# Patient Record
Sex: Male | Born: 1950
Health system: Southern US, Community
[De-identification: ages and names within clinical notes are randomized; demographics above are authoritative.]

## PROBLEM LIST (undated history)

## (undated) DIAGNOSIS — R112 Nausea with vomiting, unspecified: Secondary | ICD-10-CM

## (undated) DIAGNOSIS — Z9889 Other specified postprocedural states: Secondary | ICD-10-CM

## (undated) DIAGNOSIS — I10 Essential (primary) hypertension: Secondary | ICD-10-CM

## (undated) DIAGNOSIS — K759 Inflammatory liver disease, unspecified: Secondary | ICD-10-CM

## (undated) DIAGNOSIS — G4730 Sleep apnea, unspecified: Secondary | ICD-10-CM

## (undated) HISTORY — PX: OTHER SURGICAL HISTORY: SHX169

---

## 2005-01-31 ENCOUNTER — Ambulatory Visit (HOSPITAL_COMMUNITY): Admission: RE | Admit: 2005-01-31 | Discharge: 2005-01-31 | Payer: Self-pay | Admitting: Gastroenterology

## 2009-07-22 ENCOUNTER — Ambulatory Visit: Payer: Self-pay | Admitting: Sports Medicine

## 2009-07-22 DIAGNOSIS — M25519 Pain in unspecified shoulder: Secondary | ICD-10-CM | POA: Insufficient documentation

## 2009-07-22 DIAGNOSIS — M67919 Unspecified disorder of synovium and tendon, unspecified shoulder: Secondary | ICD-10-CM | POA: Insufficient documentation

## 2009-07-22 DIAGNOSIS — M719 Bursopathy, unspecified: Secondary | ICD-10-CM

## 2009-07-22 DIAGNOSIS — M76829 Posterior tibial tendinitis, unspecified leg: Secondary | ICD-10-CM | POA: Insufficient documentation

## 2009-07-22 DIAGNOSIS — M25579 Pain in unspecified ankle and joints of unspecified foot: Secondary | ICD-10-CM | POA: Insufficient documentation

## 2009-08-11 ENCOUNTER — Ambulatory Visit: Payer: Self-pay | Admitting: Sports Medicine

## 2009-08-11 DIAGNOSIS — M21962 Unspecified acquired deformity of left lower leg: Secondary | ICD-10-CM

## 2009-08-11 DIAGNOSIS — M21961 Unspecified acquired deformity of right lower leg: Secondary | ICD-10-CM

## 2009-08-19 ENCOUNTER — Ambulatory Visit: Payer: Self-pay | Admitting: Sports Medicine

## 2009-09-22 ENCOUNTER — Ambulatory Visit: Payer: Self-pay | Admitting: Sports Medicine

## 2009-11-11 ENCOUNTER — Ambulatory Visit: Payer: Self-pay | Admitting: Sports Medicine

## 2010-06-02 NOTE — Assessment & Plan Note (Signed)
Summary: NP,SHOULDER/ANKLE PAIN,GOLFER,MC   Vital Signs:  Patient profile:   60 year old male Height:      74 inches Weight:      238 pounds BMI:     30.67 BP sitting:   162 / 97  Vitals Entered By: Lillia Pauls CMA (July 22, 2009 1:36 PM)  History of Present Illness: Left medial ankle pain x 1 yr 3 to 4 mos of swellign and inc pain wearing ace bandage and ankle lock with tape this has helped still catches on turn sloped hill standing hurts pulling foot back into plantar flex hurts  playing golf jan no warmup did an aggressive sweing has had pain post shoulder ever since hit golf balls recently and had pain w all of these but grad gets less   Physical Exam  General:  Well-developed,well-nourished,in no acute distress; alert,appropriate and cooperative throughout examination Msk:  marked pes planus bilat w shift of midfoot  swelling along post edge of med malleolus direct TTP on inf and post edge of malleolus pain on resisted testing post tib does not fxn on heel raise  ankle ligs stable  gait pronated  .RC testing wnl except weakness of ER this is consistent in all planes and only on left neg signs of impingement     Impression & Recommendations:  Problem # 1:  ANKLE PAIN, LEFT (ICD-719.47) This is persisten but very positional Ice as needed NSAIDS ankle support  Problem # 2:  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

## 2010-06-02 NOTE — Assessment & Plan Note (Signed)
Summary: fu foot per Mirabelle Cyphers/mjd   Vital Signs:  Patient profile:   60 year old male BP sitting:   131 / 90  Vitals Entered By: Lillia Pauls CMA (Sep 22, 2009 1:51 PM)  History of Present Illness: doing better using brace and or othotics  no swelling able to walk and stand w minimal pain somteimes gets mild pain toward end of day or if standing too long or walking steps hurts just at left medial malleolus tip  tried without brace 1 day and had a lot more pain  orthotics feel comfortable   Physical Exam  General:  Well-developed,well-nourished,in no acute distress; alert,appropriate and cooperative throughout examination Msk:  LEFT ankle shows no swelling; stable lateral and medial ligaments; squeeze test and kleiger test unremarkable; talar dome seems nontender; no sign of peroneal tendon subluxations; no pain at base of 5th MT. 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

## 2010-06-02 NOTE — Assessment & Plan Note (Signed)
Summary: F/U ANKLE/SHOULDER,MC   Vital Signs:  Patient profile:   60 year old male BP sitting:   136 / 84  Vitals Entered By: Lillia Pauls CMA (August 11, 2009 10:47 AM)  History of Present Illness: Notes significant improvement wrt to his left shoulder and left ankle pain. Performing left shoulder theraband exercises (mainly ER). Still some left shoulder pain only on end ER in flexed & abducted position; relieved by position change. Has played golf in the interim, with decreased pain on swinging.  Left ankle pain and swelling have significantly decreased. No paresthesias. Walking without pain. Equivocal re: moderating factors and when he experiences pain while getting in and out of golf cart. No tearing sensation.    Physical Exam  General:  Well-developed,well-nourished,in no acute distress; alert,appropriate and cooperative throughout examination Msk:  SHOULDERS: Full ROM. Significantly increased left ER strength. Decreased pain on end ER on left. (-) Hawkins/Jobe on left. No ttp.  ANKLES: Mild to moderate swelling along left posterior tibialis distribution. Mild respective ttp behind left medial malleolus. Significantly limited inversion with 3/5 strength. Trouble with end heel raise on left though he is able to perform this maneuver. Longitudinal arch collapse with excessive pronation.   Additional Exam:  Musculoskeletal Ultrasound: Longitudinal and transverse views of the posterior left ankle revealed the following:  Partial intrasubstance tear of the posterior tibialis tendon with significant hypoechogenic surrounding areas consistent with fluid collection. Intacnt flexor tendons.   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  - Gentle pidgeon-toe heel raises daily as tolerated. Start with 15 daily and gradually transition toward 30  reps/daily as tolerated. - Continue to wear shoes with comforthotics. - Avoid running or other activities which require forceful plantarflexion and inversion of the ankles/feet. - RTC in 1-2 wks for reassessment and custom orthotic construction   Orders: [place] LIMITED ([id number])  Problem # 2:  ROTATOR CUFF SYNDROME, LEFT (ICD-726.10) Assessment: Improved  - Continue current rehab routine.  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 1/4 patch to affected area daily Prescriptions: NITRO-DUR 0.2 MG/HR PT24 (NITROGLYCERIN) 1/4 patch to affected area daily  #30 x 0   Entered and Authorized by:   Valarie Merino MD   Signed by:   Valarie Merino MD on 08/11/2009   Method used:   Print then Give to Patient   RxID:   [id number] NITRO-DUR 0.2 MG/HR PT24 (NITROGLYCERIN) 1/4 patch to affected area daily  #30 x 0   Entered and Authorized by:   Valarie Merino MD   Signed by:   Valarie Merino MD on 08/11/2009   Method used:   Print then Give to Patient   RxID:   [id number]   Appended Document: F/U ANKLE/SHOULDER,MC Problem #1: NTG patches. Counseled re: potential adverse medication effects.

## 2010-06-02 NOTE — Assessment & Plan Note (Signed)
Summary: 11:30 APPT PER [facility]   History of Present Illness: 60 yo M returns for 1 week f/u of left ankle posterior tibialis tendon partial tear for custom orthotics  Significant pes planus and almost ankle subluxation L especially on last exam Able to do exercises prescribed at last visit Tried nitro patches but caused severe headache so had to stop. Has done well with temporary sports insoles  Does feel some better even from last visit a week ago.    Medications Prior to Update: 1)  Nitro-Dur 0.2 Mg/hr Pt24 (Nitroglycerin) .... 1/4 Patch To Affected Area Daily  Physical Exam  General:  Well-developed,well-nourished,in no acute distress; alert,appropriate and cooperative throughout examination Msk:  L ankle:: Mild to moderate swelling along left posterior tibialis distribution.  Mild ttp behind left medial malleolus.  Significantly limited inversion with 3/5 strength. Difficulty with heel raise More collapse of left longitudinal arch compared to right Significant pes planus. No hallux rigidus. Wide forefoot and transverse arch collapse.   Impression & Recommendations:  Problem # 1:  ANKLE PAIN, LEFT (ICD-719.47) Assessment Improved  Patient was fitted for a : standard, cushioned, semi-rigid orthotic. The orthotic was heated and afterward the patient stood on the orthotic blank positioned on the orthotic stand. The patient was positioned in subtalar neutral position and 10 degrees of ankle dorsiflexion in a weight bearing stance. After completion of molding, a stable base was applied to the orthotic blank. The blank was ground to a stable position for weight bearing. Size: 12 blue fasttek Base: blue med density eva Posting: first ray on left Additional orthotic padding: medial heel wedges bilaterally Total prep time 45 minutes.  These felt comfortable to patient, gait much improved with minimal pronation on left only with pushoff.  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  d/c nitro as caused headaches.  F/u in 5 weeks for reevaluation, ultrasound.  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  Orders: Orthotic Materials, each unit [phone])

## 2010-06-02 NOTE — Assessment & Plan Note (Signed)
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  Still doing better using brace and othotics  mild swelling able to walk and stand w no pain orthotics feel comfortable except when golfing, he tapes his foot to help maintain arch and this resolves pain. Not doing strength exercises yet (has been letting it heal) Motrin helps his occasional pain.   Current Medications (verified): 1)  None  Allergies (verified): No Known Drug Allergies  Review of Systems       See HPI  Physical Exam  General:  Well-developed,well-nourished,in no acute distress; alert,appropriate and cooperative throughout examination Msk:  LEFT ankle shows no swelling; stable lateral and medial ligaments; squeeze test and kleiger test unremarkable; talar dome nontender; no sign of peroneal tendon subluxations; no pain at base of 5th MT. Testing of Post tib in plantarflexion position causes mild pain testing of flex dig and flex hallucis are neg standing shows marked loss of long arch and midfoot collapse bilat heel raise shows better post tib fxn RT than LT Additional Exam:  MSK US Shows healing and improved homogenicity of PT tendon.  There are still some areas with fluid filled gaps.  There is minimal increased vascularity.  Images saved.   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 Cont ankle brace 3 to 6 mos total since signs of healing seen on [place]. good progress to date ice daily RTC 6 weeks.  Orders: [place] LIMITED ([id number])  Problem # 2:  OTHER ACQUIRED DEFORMITY OF  ANKLE AND FOOT OTHER (ICD-736.79) Assessment: Unchanged Severe pes planus, use orthotics as often as possible.  Patient Instructions: 1)  Great to meet you today, 2)  your tendon is still injured but looks better. 3)  Start the theraband exercises as we discussed, 3 sets of 10 each. Back off if it starts to hurt. 4)  Come back in 6 weeks. 5)  Be sure to follow up your blood pressure of 170/100 with your family doctor! 6)  -Dr. Benjamin Stain

## 2010-09-16 NOTE — Op Note (Signed)
NAME:  Robert Barron, Robert Barron NO.:  [id number]   MEDICAL RECORD NO.:  [id number]          PATIENT TYPE:  AMB   LOCATION:  ENDO                         FACILITY:  [facility]   PHYSICIAN:  Danise Edge, M.D.   DATE OF BIRTH:  December 31, 1950   DATE OF PROCEDURE:  01/31/2005  DATE OF DISCHARGE:                                 OPERATIVE REPORT   PROCEDURE:  Screening colonoscopy.   REFERRING PHYSICIAN:  Dr. Kirby Funk.   INDICATIONS:  [name] is a 60 year old male born February 23, 1951. [name] is scheduled to undergo his first screening colonoscopy  with polypectomy to prevent colon cancer.   ENDOSCOPIST:  Danise Edge, M.D.   PREMEDICATION:  Versed 7 mg, Demerol 70 mg.   DESCRIPTION OF PROCEDURE:  After obtaining informed consent, [name]  was placed in the left lateral decubitus position. I administered  intravenous Demerol and intravenous Versed to achieve conscious sedation for  the procedure. The patient's blood pressure, oxygen saturation and cardiac  rhythm were monitored throughout the procedure and documented in the medical  record.   Anal inspection was normal. Digital rectal exam revealed a nonnodular  prostate. The Olympus adjustable pediatric colonoscope was introduced into  the rectum and advanced to the cecum. A normal-appearing ileocecal valve and  appendiceal orifice were identified. Colonic preparation for the exam today  was excellent.   RECTUM:  Normal. Retroflex view of the distal rectum normal.  SIGMOID COLON AND DESCENDING COLON:  Normal.  SPLENIC FLEXURE:  Normal.  TRANSVERSE COLON:  Normal.  HEPATIC FLEXURE:  Normal.  ASCENDING COLON:  Normal.  CECUM AND ILEOCECAL VALVE:  Normal.   ASSESSMENT:  Normal screening proctocolonoscopy to the cecum.           ______________________________  Danise Edge, M.D.    MJ/MEDQ  D:  01/31/2005  T:  01/31/2005  Job:  [id number]   cc:   Thora Lance, M.D.  Fax: [phone]

## 2011-05-05 ENCOUNTER — Ambulatory Visit (INDEPENDENT_AMBULATORY_CARE_PROVIDER_SITE_OTHER): Payer: BC Managed Care – PPO | Admitting: Sports Medicine

## 2011-05-05 ENCOUNTER — Encounter: Payer: Self-pay | Admitting: Sports Medicine

## 2011-05-05 VITALS — BP 130/81 | HR 60 | Ht 74.0 in | Wt 238.0 lb

## 2011-05-05 DIAGNOSIS — M76829 Posterior tibial tendinitis, unspecified leg: Secondary | ICD-10-CM

## 2011-05-05 DIAGNOSIS — M216X9 Other acquired deformities of unspecified foot: Secondary | ICD-10-CM

## 2011-05-05 MED ORDER — KETOPROFEN POWD: Status: DC

## 2011-05-05 NOTE — Patient Instructions (Signed)
Continue to use orthotics in all shoes  We want to replace more rigid ankle brace with a compression sleeve that you can use when walking and standing  Use arch pads in any shoes that will not allow orthotics  Try topical cream over foot if you have pain

## 2011-05-05 NOTE — Assessment & Plan Note (Addendum)
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  The orthotic was heated and the patient stood on the orthotic blank positioned on the orthotic stand. The patient was positioned in subtalar neutral position and 10 degrees of ankle dorsiflexion in a weight bearing stance. After molding, a stable Fast-Tech EVA base was applied to the orthotic blank.   The blank was ground to a stable position for weight bearing. Size:12 blue leather base: Blue EVA posting: heel wedges additional orthotic padding: first ray posts  Preparation time 40 minutes  Gait is significantly improved after placement of orthotics  He should continue to use orthotics in all shoes  Recheck if this is causing problems or the orthotics are wearing out

## 2011-05-05 NOTE — Progress Notes (Signed)
  Subjective:    Patient ID: Robert Barron, male    DOB: [birth year], 61 y.o.   MRN: [id number]  HPI  Pt presents to clinic for f/u of Lt post tib tendinopathy that had been doing well until 1 month ago.  He was going up and down stairs a lot without his ankle brace, and this aggravated his pain.  Had continued to tape ankle for athletics. Using orthotics in all shoes which are helpful.   He would like to get a new pair of  Orthotics as he has to use them in multiple shoes  Review of Systems     Objective:   Physical Exam nad  Lt foot/ankle exam: Complete loss of long arch with shift of mid foot Hyperemic color change over entire mid foot Ankle ligaments stable No swelling over med malleolus No ttp of ankle  Mild calcaneal valgus  Color change noted on lt foot, not present on rt- slight reddening over mid foot collapse on rt  Increase in calcaneal valgus on rt  Smoking gait shows extreme pronation     Assessment & Plan:

## 2011-05-05 NOTE — Assessment & Plan Note (Signed)
I think the active tendinopathy is much better  The posterior tibial tendon still does not function well because of the degree of arch collapse

## 2012-02-29 ENCOUNTER — Ambulatory Visit (INDEPENDENT_AMBULATORY_CARE_PROVIDER_SITE_OTHER): Payer: BC Managed Care – PPO | Admitting: Sports Medicine

## 2012-02-29 ENCOUNTER — Encounter: Payer: Self-pay | Admitting: Sports Medicine

## 2012-02-29 VITALS — BP 147/81 | HR 54 | Ht 74.0 in | Wt 240.0 lb

## 2012-02-29 DIAGNOSIS — M25569 Pain in unspecified knee: Secondary | ICD-10-CM

## 2012-02-29 DIAGNOSIS — M25561 Pain in right knee: Secondary | ICD-10-CM | POA: Insufficient documentation

## 2012-02-29 MED ORDER — MELOXICAM 15 MG PO TABS: 15.0000 mg | ORAL_TABLET | Freq: Every day | ORAL | Status: DC

## 2012-02-29 NOTE — Assessment & Plan Note (Signed)
Certainly with patellar tendinosis.  Additionally medial meniscus injury is possible.  Plan: Relative rest oral meloxicam topical Aspercreme, ice, compressive knee sleeve as tolerated.  Gentle range of motion exercises.  Followup in 2-3 weeks.  Patient expresses understanding

## 2012-02-29 NOTE — Progress Notes (Signed)
Robert Barron is a 61 y.o. male who presents to [facility] today for right anterior knee pain.  Starting yesterday after playing golf.  Patient pushed his golf bag in a cart up and down hills yesterday while playing golf.  He noted soreness in the anterior portion of his left knee but worsened overnight and became stiff and painful this morning.  He has significant -- moderate pain.  Pain worsens with knee extension and improves with rest. The pain is localized to the anterior and medial portion of his right knee.  He denies any locking catching or significant swelling.  He has not tried any pain medications.  He denies any radiating pain weakness or numbness and feels well otherwise.   Today he is walking with a significant limp because it hurts to bend the knee too much or straighten it out completely  Previously has been treated for posterior tibialis tendinitis with nitroglycerin patches which caused significant headaches.   PMH reviewed. Mildly elevated blood pressure History  Substance Use Topics  . Smoking status: Never Smoker   . Smokeless tobacco: Never Used  . Alcohol Use: Not on file   ROS as above otherwise neg   Exam:  BP 147/81  Pulse 54  Ht 6\' 2"  (1.88 m)  Wt 240 lb (108.863 kg)  BMI 30.81 kg/m2 Gen: Well NAD MSK: Right knee: Normal-appearing no significant visual effusion or erythema. Mildly tender over the patellar tendon and medial joint line Range of motion 0-120 pain on extension.  Strength 4+/5 to extension 5/5 to flexion Negative patellar grind and patellar apprehension tests Mildly positive medial McMurray's test however difficult to interpret secondary to anterior knee pain. Negative Lachman's valgus varus stress.   Distal pulses sensation and capillary refill are intact no significant distal edema bilaterally  Musculoskeletal ultrasound of the right knee: Patellar tendon thickness 0.48 mm. Diffuse hypoechoic change within the proximal and distal portions  seen on longitudinal and transverse views. No significant neovessels formation within the tendon. Quad tendon intact and normal appearing mild effusion seen.  Medial meniscus normal appearing as is lateral meniscus.   No results found.

## 2012-02-29 NOTE — Patient Instructions (Addendum)
Thank you for coming in today. You have patellar-tendonitis.  You may also have a meniscus injury to the inside part of the knee.  Please take the meloxicam as needed daily.  Apply the aspercream as needed to the front part of the knee.  Ice massage.  Please take it easy and come back in 2-3 weeks.  Rehab on a stationary bike with the seat high.

## 2012-03-13 ENCOUNTER — Ambulatory Visit (INDEPENDENT_AMBULATORY_CARE_PROVIDER_SITE_OTHER): Payer: BC Managed Care – PPO | Admitting: Family Medicine

## 2012-03-13 ENCOUNTER — Encounter: Payer: Self-pay | Admitting: Family Medicine

## 2012-03-13 VITALS — BP 151/88 | HR 63 | Ht 74.0 in | Wt 240.0 lb

## 2012-03-13 DIAGNOSIS — M25569 Pain in unspecified knee: Secondary | ICD-10-CM

## 2012-03-13 DIAGNOSIS — M25561 Pain in right knee: Secondary | ICD-10-CM

## 2012-03-13 DIAGNOSIS — I10 Essential (primary) hypertension: Secondary | ICD-10-CM

## 2012-03-13 NOTE — Progress Notes (Signed)
I was present during Robert Barron interview, exam and agree with his findings and his assessment and plan.   Sirus Labrie

## 2012-03-13 NOTE — Patient Instructions (Addendum)
Thank you for coming in today. Come back as needed.  Wear the sleeve during and 1 hour after exercise.  Take the mobic only as needed.  Avoid deep knee bends and squats.

## 2012-03-13 NOTE — Progress Notes (Signed)
Patient ID: Robert Barron, male   DOB: [birth year], 61 y.o.   MRN: [id number]  SUBJECTIVE: Robert Barron is a 61 y.o. male with unremarkable PMHx presenting to sports medicine clinic for f/u appointment of RIGHT knee pain.  Pt seen last on 02/29/2012 and thought to have anterior, medial knee pain with probably patellar tendinosis and +/- medial meniscal injury based on physical and sonographic findings.  Since that visit patient has been using body helix sleeve with activity or on tough days and for 1 hour following activity, took scheduled meloxicam for 2 weeks to combat swelling, inflammation.  Patient reports occasional stiffness with prolonged sitting in knee flexion (at work).  Otherwise, patient is pain-free throughout the day and with activity.  He has allowed knee to rest and avoided golfing, biking or heavy lifting.  He has been pain-free with ADL and prolonged walking.  Denies any repeat knee swelling, locking or instability.  He has occasional popping but that is not new.  Reports not using anti-inflammatories or knee sleeve since Saturday with no repeat knee swelling or pain since self-removing both.    PMHx: None  PSHx: None  Meds: None scheduled  Allergies: None  OBJECTIVE: Vital signs as noted to right.  Slightly elevated BP (151/88) otherwise normal. Appearance --> Slightly obese male but is doing well  Knee exam:  - No abnormalities on inspection (erythema, edema or edema) - AROM, PROM in bilateral knee flexion and extension - 5/5 knee flexion and extension.  Some pain with resisted knee flexion reproduced at medial aspect of RIGHT knee - Negative Lachman's  - Negative Anterior/Posterior drawer - Positive McMurray's at medial aspect of knee with popping, pain (RIGHT) - Positive posterior dial test with pain at medial aspect of knee joint,  posterior aspect - Negative Valgus and varus testing with firm endpoint.  Valgus testing at 0 and 30 degrees reproduced  medial knee pain on RIGHT knee, worse in 0 degrees than 30.    ASSESSMENT: 1. Medial meniscal pathology vs degeneration of RIGHT knee 2. Elevated blood pressure not diagnostic of HTN  PLAN: Given age, activity level patient likely has degenerative process in RIGHT knee, medial aspect with potential meniscal pathology in same location.  Will avoid any activities that exacerbate pain, mainly major knee flexion.  Encouraged gradual return to golf, stationary exercise bike but to wear body helix during both as indicated in patient instructions.  Oral NSAIDs as needed and icing for any pain.    Instructed on red-flags and reasons to return to care for knee, mainly if it inhibits his ADLs or interferes with important aspect of his life, exercise routine (golf) He will follow-up as needed with [place] for his knee pain. Also, informed patient he should follow-up with his PCM regarding blood pressure as it was elevated today at 151/88.  -- Kenney Houseman, MS IV

## 2013-02-12 ENCOUNTER — Encounter: Payer: Self-pay | Admitting: Emergency Medicine

## 2013-02-12 ENCOUNTER — Ambulatory Visit (INDEPENDENT_AMBULATORY_CARE_PROVIDER_SITE_OTHER): Payer: BC Managed Care – PPO | Admitting: Emergency Medicine

## 2013-02-12 VITALS — BP 174/82 | HR 57 | Ht 74.0 in | Wt 240.0 lb

## 2013-02-12 DIAGNOSIS — M216X9 Other acquired deformities of unspecified foot: Secondary | ICD-10-CM

## 2013-02-12 NOTE — Progress Notes (Signed)
Patient ID: Robert Barron, male   DOB: Sep 08, 1950, 62 y.o.   MRN: [id number] 62 year old male presents for new custom orthotics. He's been using his orthotics extensively and it started to wear. He has no current complaints. His orthotics have been helping his feet significantly. He uses them both in business and casual shoes as well as cultures.  No past medical history on file. No past surgical history on file. Allergies  Allergen Reactions  . Nitroglycerin     Severe headache   Review of systems as per history of present illness otherwise negative  Examination:  Well-developed well-nourished 62 year old male awake alert oriented in no acute distress  Feet Bilateral calcaneal valgus Pes planus  Patient was fitted for a :  semi-rigid orthotic. The orthotic was heated and afterward the patient stood on the orthotic blank positioned on the orthotic stand. The patient was positioned in subtalar neutral position and 10 degrees of ankle dorsiflexion in a weight bearing stance. After completion of molding, a stable base was applied to the orthotic blank. The blank was ground to a stable position for weight bearing. Size:13 Base: eva Posting: 1st ray Additional orthotic padding: med heel wedge  Greater than 40 minutes of time spent face-to-face and production of custom orthotics and history and analysis of gait in new orthotics.

## 2013-02-12 NOTE — Assessment & Plan Note (Signed)
New orthotics produced today to replace prior.

## 2013-08-20 ENCOUNTER — Ambulatory Visit
Admission: RE | Admit: 2013-08-20 | Discharge: 2013-08-20 | Disposition: A | Discharge status: Home / Self Care | Payer: BC Managed Care – PPO | Source: Ambulatory Visit | Attending: Emergency Medicine | Admitting: Emergency Medicine

## 2013-08-20 ENCOUNTER — Ambulatory Visit (INDEPENDENT_AMBULATORY_CARE_PROVIDER_SITE_OTHER): Payer: BC Managed Care – PPO | Admitting: Emergency Medicine

## 2013-08-20 ENCOUNTER — Encounter: Payer: Self-pay | Admitting: Emergency Medicine

## 2013-08-20 VITALS — BP 130/83 | Ht 74.0 in | Wt 250.0 lb

## 2013-08-20 DIAGNOSIS — M25569 Pain in unspecified knee: Secondary | ICD-10-CM

## 2013-08-20 DIAGNOSIS — M545 Low back pain, unspecified: Secondary | ICD-10-CM

## 2013-08-20 DIAGNOSIS — M25561 Pain in right knee: Secondary | ICD-10-CM

## 2013-08-20 MED ORDER — MELOXICAM 15 MG PO TABS: 15.0000 mg | ORAL_TABLET | Freq: Every day | ORAL | Status: DC | PRN

## 2013-08-20 NOTE — Progress Notes (Signed)
Patient ID: Robert Barron, male   DOB: [birth year], 63 y.o.   MRN: [id number] 63 year old male presents with 2 complaints.  #1. The complaint of right knee pain. He started to play golf again this spring and notices a little but of an ache in his knee. Is able to play 9 holes of golf without significant limitation however, after playing 18 holes of golf he is a significant amount of swelling and discomfort in his knee. He has been wearing a patellar strap brace with minimal relief. Has not taken any oral medications. No acute injury. Describes the pain as a diffuse ache. Also notes pain in his knee with prolonged standing. Pain is worse when going up and down stairs or up and down hills as well. When his knee was swollen he noticed no significant amount of pain in the posterior region of his knee.  #2. His a complaint of lumbar back pain. He bent over to pick up a tie clip one week ago and noticed a sharp twinge in his low back. He had some spasm after that and was unable to work that day. The pain subsided. There was no radiation of pain down either leg. He noticed again 2 days ago another twinge of low back pain very similar. It is subsided but not completely resolved at this time. Again there was no radiation of symptoms down his legs. No weakness or numbness. No pain with twisting motions and he is able to play golf without significant limitation. He was also able to climb up a ladder to do overhead painting without limitation or pain in his back. He notes when sitting in a car with legs extended he has discomfort in his back.  Pertinent past medical history: Right knee meniscal injury, hypertension  Social history: Nonsmoker occasional alcohol  Review of systems: As per history of present illness otherwise all systems negative  Examination: BP 130/83  Ht 6\' 2"  (1.88 m)  Wt 250 lb (113.399 kg)  BMI 32.08 kg/m2 Right Knee: No erythema or obvious bony abnormalities. Mild effusion. Palpation  normal with no warmth or joint line tenderness or condyle tenderness. Crepitus noted with patellar compression and extension. ROM normal in flexion and extension and lower leg rotation. Ligaments with solid consistent endpoints including ACL, PCL, LCL, MCL. Negative Mcmurray's and provocative meniscal tests. Non painful patellar compression. Patellar and quadriceps tendons unremarkable. Hamstring and quadriceps strength is normal.  Back: FROM Negative straight leg raise bilaterally. DTRs intact. 5/5 strength Pulses equal bilaterally.

## 2013-08-20 NOTE — Assessment & Plan Note (Signed)
His presentation is consistent with muscular low back pain. I suspect that he strained the muscles when bending over. The daily MOBIC as well as activity modification will hopefully alleviate the symptoms. Lumbosacral x-rays were ordered to rule out any underlying bony pathology which may have incited this event.

## 2013-08-20 NOTE — Assessment & Plan Note (Signed)
Patient will wear body helix compression sleeve with any exertional activities. In addition he'll use ice. I restarted him on daily MOBIC and anti-inflammatory. X-rays his knee were ordered today in the office. He has a history of a meniscal injury however, this presentation is somewhat different. I suspect the possibility of degenerative joint disease given his presentation. We'll evaluate the x-rays and his response to this conservative treatment and discuss further options. I will call him with the x-ray results at [phone].

## 2013-08-21 ENCOUNTER — Telehealth: Payer: Self-pay | Admitting: Emergency Medicine

## 2013-08-21 NOTE — Telephone Encounter (Signed)
Left Mr. Robert Barron a message about his xray results.

## 2014-07-03 ENCOUNTER — Ambulatory Visit (INDEPENDENT_AMBULATORY_CARE_PROVIDER_SITE_OTHER): Payer: 59 | Admitting: Family Medicine

## 2014-07-03 ENCOUNTER — Encounter: Payer: Self-pay | Admitting: Family Medicine

## 2014-07-03 VITALS — BP 155/81 | HR 52 | Ht 74.0 in | Wt 240.0 lb

## 2014-07-03 DIAGNOSIS — R269 Unspecified abnormalities of gait and mobility: Secondary | ICD-10-CM | POA: Insufficient documentation

## 2014-07-03 NOTE — Progress Notes (Signed)
  Robert Barron - 64 y.o. male MRN [id number]  Date of birth: Feb 07, 1951  SUBJECTIVE:  Including CC & ROS.  Patient's 64 year old gentleman who presents today for treatment of his known gait abnormalities as well as known severe pes planus and navicular drop. Patient has been wearing custom orthotics since childhood and finds them to be clinically significant in controlling foot pain and mechanical gait abnormalities.  Patient's only complaint today is some left medial plantar pain that started after a 2 day golfing trip. Pain is been worse in the morning. He's been treating with ankle taping and arch taping with some clinical improvement. No anti-inflammatories or stretching has been done.   ROS: Review of systems otherwise negative except for information present in HPI  HISTORY: Past Medical, Surgical, Social, and Family History Reviewed & Updated per EMR. Pertinent Historical Findings include: Hypertension Low back pain Gait abnormalities Nonsmoker No family history muster skeletal disorders  DATA REVIEWED: No other imaging available  PHYSICAL EXAM:  VS: BP:(!) 155/81 mmHg  HR:(!) 52bpm  TEMP: ( )  RESP:   HT:6\' 2"  (188 cm)   WT:240 lb (108.863 kg)  BMI:30.9 FEET/ GAIT EXAM:  General: well nourished Skin of LE: warm; dry, no rashes, lesions, ecchymosis or erythema. Vascular: Dorsal pedal pulses 2+ bilaterally Neurologically: Sensation to light touch lower extremities equal and intact bilaterally.  Observation - no ecchymosis, no edema, or hematoma present  Palpation: mild tenderness over the medial plantar heel at the plantar fascial insertion Normal ankle motion and strength bilaterally  Extension/flexion 5/5 strength bilaterally in toes Weight-bearing foot exam:  First ray: slight medial rotation but slightly rigid  Second ray: Morton's foot Longitudinal arch: severe collapse of medial longitudinal arch with drop navicular Heel position: valgus Leg Length discrepancy:  Right leg 37-1/2 inches, left leg 38 inches  ASSESSMENT & PLAN: See problem based charting & AVS for pt instructions. Impression: Gait abnormality related to poor foot mechanics including pes cavus, Morton's foot, navicular drop, valgus heel, and leg length discrepancy, and pronated gait  Orthotic Fitting and Adjustment note: Patient was fitted for a : standard, cushioned, semi-rigid orthotic.  The orthotic was heated and afterward the patient stood on the orthotic blank positioned on the orthotic stand.  The patient was positioned in subtalar neutral position and 10 degrees of ankle dorsiflexion in a weight bearing stance.  After completion of molding, a stable base was applied to the orthotic blank.  The blank was ground to a stable position for weight bearing.  Size: 12 Base: Blue EVA Posting: first ray post due to excessive pressure from pronation Additional orthotic padding: medial wedge to provide additional pronation control  Greater than 50% of the patient's visit for a total of 30 minutes, was spent conducting face-to-face counseling for bilateral foot orthotics fitting and constructing

## 2014-07-23 ENCOUNTER — Other Ambulatory Visit: Payer: Self-pay | Admitting: Gastroenterology

## 2014-10-07 ENCOUNTER — Encounter (HOSPITAL_COMMUNITY): Payer: Self-pay | Admitting: *Deleted

## 2014-10-08 ENCOUNTER — Other Ambulatory Visit: Payer: Self-pay | Admitting: Gastroenterology

## 2014-10-13 ENCOUNTER — Encounter (HOSPITAL_COMMUNITY)
Admission: RE | Disposition: A | Discharge status: Home / Self Care | Payer: Self-pay | Source: Ambulatory Visit | Attending: Gastroenterology

## 2014-10-13 ENCOUNTER — Ambulatory Visit (HOSPITAL_COMMUNITY): Payer: 59 | Admitting: Anesthesiology

## 2014-10-13 ENCOUNTER — Ambulatory Visit (HOSPITAL_COMMUNITY)
Admission: RE | Admit: 2014-10-13 | Discharge: 2014-10-13 | Disposition: A | Discharge status: Home / Self Care | Payer: 59 | Source: Ambulatory Visit | Attending: Gastroenterology | Admitting: Gastroenterology

## 2014-10-13 ENCOUNTER — Encounter (HOSPITAL_COMMUNITY): Payer: Self-pay

## 2014-10-13 DIAGNOSIS — Z79899 Other long term (current) drug therapy: Secondary | ICD-10-CM | POA: Insufficient documentation

## 2014-10-13 DIAGNOSIS — I10 Essential (primary) hypertension: Secondary | ICD-10-CM | POA: Insufficient documentation

## 2014-10-13 DIAGNOSIS — G4730 Sleep apnea, unspecified: Secondary | ICD-10-CM | POA: Insufficient documentation

## 2014-10-13 DIAGNOSIS — Z1211 Encounter for screening for malignant neoplasm of colon: Secondary | ICD-10-CM | POA: Diagnosis not present

## 2014-10-13 HISTORY — PX: COLONOSCOPY WITH PROPOFOL: SHX5780

## 2014-10-13 HISTORY — DX: Inflammatory liver disease, unspecified: K75.9

## 2014-10-13 HISTORY — DX: Essential (primary) hypertension: I10

## 2014-10-13 HISTORY — DX: Other specified postprocedural states: Z98.890

## 2014-10-13 HISTORY — DX: Sleep apnea, unspecified: G47.30

## 2014-10-13 HISTORY — DX: Other specified postprocedural states: R11.2

## 2014-10-13 SURGERY — COLONOSCOPY WITH PROPOFOL
Anesthesia: Monitor Anesthesia Care

## 2014-10-13 MED ORDER — LIDOCAINE HCL (CARDIAC) 20 MG/ML IV SOLN
INTRAVENOUS | Status: AC
  Filled 2014-10-13: qty 5

## 2014-10-13 MED ORDER — MIDAZOLAM HCL 5 MG/5ML IJ SOLN
INTRAMUSCULAR | Status: DC | PRN
  Administered 2014-10-13 (×2): 1 mg via INTRAVENOUS

## 2014-10-13 MED ORDER — PROPOFOL 10 MG/ML IV BOLUS
INTRAVENOUS | Status: AC
  Filled 2014-10-13: qty 20

## 2014-10-13 MED ORDER — LIDOCAINE HCL 1 % IJ SOLN
INTRAMUSCULAR | Status: DC | PRN
  Administered 2014-10-13: 50 mg via INTRADERMAL

## 2014-10-13 MED ORDER — MIDAZOLAM HCL 2 MG/2ML IJ SOLN
INTRAMUSCULAR | Status: AC
  Filled 2014-10-13: qty 2

## 2014-10-13 MED ORDER — PROPOFOL 10 MG/ML IV BOLUS
INTRAVENOUS | Status: DC | PRN
  Administered 2014-10-13 (×4): 20 mg via INTRAVENOUS
  Administered 2014-10-13: 10 mg via INTRAVENOUS
  Administered 2014-10-13 (×3): 20 mg via INTRAVENOUS
  Administered 2014-10-13 (×2): 10 mg via INTRAVENOUS

## 2014-10-13 MED ORDER — LACTATED RINGERS IV SOLN
INTRAVENOUS | Status: DC
  Administered 2014-10-13: 1000 mL via INTRAVENOUS

## 2014-10-13 SURGICAL SUPPLY — 22 items

## 2014-10-13 NOTE — Anesthesia Preprocedure Evaluation (Addendum)
Anesthesia Evaluation  Patient identified by MRN, date of birth, ID band Patient awake    Reviewed: Allergy & Precautions, H&P , NPO status , Patient's Chart, lab work & pertinent test results  History of Anesthesia Complications (+) PONV  Airway Mallampati: II  TM Distance: >3 FB Neck ROM: full    Dental no notable dental hx. (+) Teeth Intact, Dental Advisory Given   Pulmonary sleep apnea ,  breath sounds clear to auscultation  Pulmonary exam normal       Cardiovascular Exercise Tolerance: Good hypertension, Pt. on medications Normal cardiovascular examRhythm:regular Rate:Normal     Neuro/Psych negative neurological ROS  negative psych ROS   GI/Hepatic negative GI ROS, Neg liver ROS,   Endo/Other  negative endocrine ROS  Renal/GU negative Renal ROS  negative genitourinary   Musculoskeletal   Abdominal   Peds  Hematology negative hematology ROS (+)   Anesthesia Other Findings   Reproductive/Obstetrics negative OB ROS                            Anesthesia Physical Anesthesia Plan  ASA: III  Anesthesia Plan: MAC   Post-op Pain Management:    Induction:   Airway Management Planned:   Additional Equipment:   Intra-op Plan:   Post-operative Plan:   Informed Consent: I have reviewed the patients History and Physical, chart, labs and discussed the procedure including the risks, benefits and alternatives for the proposed anesthesia with the patient or authorized representative who has indicated his/her understanding and acceptance.   Dental Advisory Given  Plan Discussed with: CRNA and Surgeon  Anesthesia Plan Comments:         Anesthesia Quick Evaluation

## 2014-10-13 NOTE — H&P (Signed)
  Procedure: Screening colonoscopy. Normal screening colonoscopy performed on 01/31/2005. No family history of colon cancer.  History: The patient is a 64 year old male born 16-Nov-1950. He is scheduled to undergo a repeat screening colonoscopy with polypectomy to prevent colon cancer.  Medication allergies: None  Past medical history: Tonsillectomy. Hypercholesterolemia. Left ankle tendon tear. Right knee medial meniscus tear.  Exam: The patient is alert and lying comfortably on the endoscopy stretcher. Abdomen is soft and nontender to palpation. Lungs are clear to auscultation. Cardiac exam reveals a regular rhythm.  Plan: Proceed with screening colonoscopy

## 2014-10-13 NOTE — Anesthesia Postprocedure Evaluation (Signed)
  Anesthesia Post-op Note  Patient: Robert Barron  Procedure(s) Performed: Procedure(s) (LRB): COLONOSCOPY WITH PROPOFOL (N/A)  Patient Location: PACU  Anesthesia Type: MAC  Level of Consciousness: awake and alert   Airway and Oxygen Therapy: Patient Spontanous Breathing  Post-op Pain: mild  Post-op Assessment: Post-op Vital signs reviewed, Patient's Cardiovascular Status Stable, Respiratory Function Stable, Patent Airway and No signs of Nausea or vomiting  Last Vitals:  Filed Vitals:   10/13/14 1137  BP: 156/80  Pulse: 52  Temp:   Resp: 14    Post-op Vital Signs: stable   Complications: No apparent anesthesia complications

## 2014-10-13 NOTE — Op Note (Signed)
Procedure: Screening colonoscopy. Normal screening colonoscopy performed on 01/31/2005. No family history of colon cancer  Endoscopist: Danise Edge  Premedication: Propofol administered by anesthesia  Procedure: The patient was placed in the left lateral decubitus position. Anal inspection and digital rectal exam were normal. The Pentax pediatric colonoscope was introduced into the rectum and advanced to the cecum. A normal-appearing appendiceal orifice and ileocecal valve were identified. Colonic preparation for the exam today was good. Withdrawal time was 10 minutes  Rectum. Normal. Retroflexed view of the distal rectum was normal  Sigmoid colon and descending colon. Normal  Splenic flexure. Normal  Transverse colon. Normal  Hepatic flexure. Normal  Ascending colon. Normal  Cecum and ileocecal valve. Normal  Assessment: Normal screening colonoscopy  Recommendation: Schedule repeat screening colonoscopy in 10 years

## 2014-10-13 NOTE — Transfer of Care (Signed)
Immediate Anesthesia Transfer of Care Note  Patient: Robert Barron  Procedure(s) Performed: Procedure(s): COLONOSCOPY WITH PROPOFOL (N/A)  Patient Location: PACU and Endoscopy Unit  Anesthesia Type:MAC  Level of Consciousness: awake, alert  and patient cooperative  Airway & Oxygen Therapy: Patient Spontanous Breathing and Patient connected to face mask oxygen  Post-op Assessment: Report given to RN, Post -op Vital signs reviewed and stable and Patient moving all extremities X 4  Post vital signs: Reviewed and stable  Last Vitals:  Filed Vitals:   10/13/14 0948  BP: 169/78  Temp: 36.4 C  Resp: 16    Complications: No apparent anesthesia complications

## 2014-10-14 ENCOUNTER — Encounter (HOSPITAL_COMMUNITY): Payer: Self-pay | Admitting: Gastroenterology

## 2015-05-06 DIAGNOSIS — I10 Essential (primary) hypertension: Secondary | ICD-10-CM | POA: Diagnosis not present

## 2015-06-28 MED FILL — AMLODIPINE BESYLATE 5 MG TA: 5 | 90 days supply | Qty: 90 | Fill #4

## 2015-09-28 MED FILL — AMLODIPINE BESYLATE 5 MG TA: 5 | 30 days supply | Qty: 30 | Fill #5

## 2015-11-04 DIAGNOSIS — I10 Essential (primary) hypertension: Secondary | ICD-10-CM | POA: Diagnosis not present

## 2015-11-04 DIAGNOSIS — Z23 Encounter for immunization: Secondary | ICD-10-CM | POA: Diagnosis not present

## 2015-11-04 MED FILL — AMLODIPINE BESYLATE 5 MG TA: 5 | 90 days supply | Qty: 90 | Fill #0

## 2016-02-02 DIAGNOSIS — G4733 Obstructive sleep apnea (adult) (pediatric): Secondary | ICD-10-CM | POA: Diagnosis not present

## 2016-02-04 MED FILL — AMLODIPINE BESYLATE 5 MG TA: 5 | 90 days supply | Qty: 90 | Fill #1

## 2016-05-08 MED FILL — AMLODIPINE BESYLATE 5 MG TA: 5 | 90 days supply | Qty: 90 | Fill #2

## 2016-05-10 DIAGNOSIS — E7800 Pure hypercholesterolemia, unspecified: Secondary | ICD-10-CM | POA: Diagnosis not present

## 2016-05-10 DIAGNOSIS — E669 Obesity, unspecified: Secondary | ICD-10-CM | POA: Diagnosis not present

## 2016-05-10 DIAGNOSIS — Z6833 Body mass index (BMI) 33.0-33.9, adult: Secondary | ICD-10-CM | POA: Diagnosis not present

## 2016-05-10 DIAGNOSIS — Z125 Encounter for screening for malignant neoplasm of prostate: Secondary | ICD-10-CM | POA: Diagnosis not present

## 2016-05-10 DIAGNOSIS — Z0000 Encounter for general adult medical examination without abnormal findings: Secondary | ICD-10-CM | POA: Diagnosis not present

## 2016-05-10 DIAGNOSIS — I10 Essential (primary) hypertension: Secondary | ICD-10-CM | POA: Diagnosis not present

## 2016-05-17 ENCOUNTER — Encounter: Payer: 59 | Admitting: Family Medicine

## 2016-05-19 ENCOUNTER — Ambulatory Visit (INDEPENDENT_AMBULATORY_CARE_PROVIDER_SITE_OTHER): Payer: 59 | Admitting: Family Medicine

## 2016-05-19 ENCOUNTER — Encounter: Payer: Self-pay | Admitting: Family Medicine

## 2016-05-19 VITALS — BP 136/82 | Ht 74.0 in | Wt 262.0 lb

## 2016-05-19 DIAGNOSIS — M21961 Unspecified acquired deformity of right lower leg: Secondary | ICD-10-CM

## 2016-05-19 DIAGNOSIS — M25561 Pain in right knee: Secondary | ICD-10-CM

## 2016-05-19 DIAGNOSIS — R269 Unspecified abnormalities of gait and mobility: Secondary | ICD-10-CM

## 2016-05-19 DIAGNOSIS — M76822 Posterior tibial tendinitis, left leg: Secondary | ICD-10-CM

## 2016-05-19 DIAGNOSIS — M21962 Unspecified acquired deformity of left lower leg: Principal | ICD-10-CM

## 2016-05-19 DIAGNOSIS — M76821 Posterior tibial tendinitis, right leg: Secondary | ICD-10-CM

## 2016-05-19 DIAGNOSIS — G8929 Other chronic pain: Secondary | ICD-10-CM

## 2016-05-19 MED ORDER — MELOXICAM 15 MG PO TABS: 15.0000 mg | ORAL_TABLET | Freq: Every day | ORAL | 2 refills | Status: AC | PRN

## 2016-05-19 MED FILL — MELOXICAM 15 MG TABLET: 15 | 30 days supply | Qty: 30 | Fill #0

## 2016-05-19 NOTE — Progress Notes (Signed)
HPI" B foot pain. Improved with custom molded orthotocs but those were made several t\years ago and are wearing out.   PE: WD WN ANKLES FROM B Feet---severe pes planus. Bhe has medial foot collapse ith R>L and subluxation medially of his navicular bone on right, VASC DP 2+B=  Patient was fitted for a : standard, cushioned, semi-rigid orthotic. The orthotic was heated, placed on the orthotic stand. The patient was positioned in subtalar neutral position and 10 degrees of ankle dorsiflexion in a weight bearing stance on the heated orthotic blank After completion of molding, a stable base was applied to the orthotic blank. The blank was ground to a stable position for weight bearing. Blank: red foam Base blue EVA Posting:  B medial heel posts, B first ray posts Face to face time spent in evaluation, measurement and manufacture of custom molded orthotic was 40 minutes.

## 2016-06-14 DIAGNOSIS — H5203 Hypermetropia, bilateral: Secondary | ICD-10-CM | POA: Diagnosis not present

## 2016-06-14 DIAGNOSIS — H52223 Regular astigmatism, bilateral: Secondary | ICD-10-CM | POA: Diagnosis not present

## 2016-06-14 DIAGNOSIS — H524 Presbyopia: Secondary | ICD-10-CM | POA: Diagnosis not present

## 2016-07-10 MED FILL — AMOXICILLIN 875 MG TABLET: 875 | 10 days supply | Qty: 20 | Fill #0

## 2016-07-21 DIAGNOSIS — H938X1 Other specified disorders of right ear: Secondary | ICD-10-CM | POA: Diagnosis not present

## 2016-08-03 MED FILL — AMLODIPINE BESYLATE 5 MG TA: 5 | 90 days supply | Qty: 90 | Fill #0

## 2016-10-31 MED FILL — AMLODIPINE BESYLATE 5 MG TA: 5 | 90 days supply | Qty: 90 | Fill #1

## 2016-11-06 DIAGNOSIS — I10 Essential (primary) hypertension: Secondary | ICD-10-CM | POA: Diagnosis not present

## 2016-11-30 DIAGNOSIS — M25552 Pain in left hip: Secondary | ICD-10-CM | POA: Diagnosis not present

## 2016-11-30 DIAGNOSIS — M270 Developmental disorders of jaws: Secondary | ICD-10-CM | POA: Diagnosis not present

## 2016-11-30 DIAGNOSIS — R609 Edema, unspecified: Secondary | ICD-10-CM | POA: Diagnosis not present

## 2017-01-02 MED FILL — MELOXICAM 15 MG TABLET: 15 | 30 days supply | Qty: 30 | Fill #1

## 2017-01-29 MED FILL — AMLODIPINE BESYLATE 5 MG TA: 5 | 90 days supply | Qty: 90 | Fill #2

## 2017-04-30 MED FILL — AMLODIPINE BESYLATE 5 MG TA: 5 | 90 days supply | Qty: 90 | Fill #3

## 2017-06-04 ENCOUNTER — Encounter (HOSPITAL_COMMUNITY): Payer: Self-pay | Admitting: Anesthesiology

## 2017-06-04 ENCOUNTER — Other Ambulatory Visit: Payer: Self-pay | Admitting: Orthopedic Surgery

## 2017-06-04 ENCOUNTER — Ambulatory Visit (HOSPITAL_COMMUNITY): Payer: No Typology Code available for payment source | Admitting: Certified Registered"

## 2017-06-04 ENCOUNTER — Encounter (HOSPITAL_COMMUNITY)
Admission: RE | Disposition: A | Discharge status: Home / Self Care | Payer: Self-pay | Source: Ambulatory Visit | Attending: Orthopedic Surgery

## 2017-06-04 ENCOUNTER — Ambulatory Visit (HOSPITAL_COMMUNITY)
Admission: RE | Admit: 2017-06-04 | Discharge: 2017-06-04 | Disposition: A | Discharge status: Home / Self Care | Payer: No Typology Code available for payment source | Source: Ambulatory Visit | Attending: Orthopedic Surgery | Admitting: Orthopedic Surgery

## 2017-06-04 DIAGNOSIS — I10 Essential (primary) hypertension: Secondary | ICD-10-CM | POA: Insufficient documentation

## 2017-06-04 DIAGNOSIS — S76112A Strain of left quadriceps muscle, fascia and tendon, initial encounter: Secondary | ICD-10-CM | POA: Insufficient documentation

## 2017-06-04 DIAGNOSIS — Z9989 Dependence on other enabling machines and devices: Secondary | ICD-10-CM | POA: Diagnosis not present

## 2017-06-04 DIAGNOSIS — Y998 Other external cause status: Secondary | ICD-10-CM | POA: Diagnosis not present

## 2017-06-04 DIAGNOSIS — Y9239 Other specified sports and athletic area as the place of occurrence of the external cause: Secondary | ICD-10-CM | POA: Diagnosis not present

## 2017-06-04 DIAGNOSIS — Y9353 Activity, golf: Secondary | ICD-10-CM | POA: Insufficient documentation

## 2017-06-04 DIAGNOSIS — Z888 Allergy status to other drugs, medicaments and biological substances status: Secondary | ICD-10-CM | POA: Diagnosis not present

## 2017-06-04 DIAGNOSIS — Z79899 Other long term (current) drug therapy: Secondary | ICD-10-CM | POA: Diagnosis not present

## 2017-06-04 DIAGNOSIS — W1840XA Slipping, tripping and stumbling without falling, unspecified, initial encounter: Secondary | ICD-10-CM | POA: Diagnosis not present

## 2017-06-04 DIAGNOSIS — S86812A Strain of other muscle(s) and tendon(s) at lower leg level, left leg, initial encounter: Secondary | ICD-10-CM | POA: Diagnosis present

## 2017-06-04 DIAGNOSIS — G4730 Sleep apnea, unspecified: Secondary | ICD-10-CM | POA: Insufficient documentation

## 2017-06-04 HISTORY — PX: PATELLAR TENDON REPAIR: SHX737

## 2017-06-04 SURGERY — REPAIR, TENDON, PATELLAR
Anesthesia: General | Site: Knee | Laterality: Left

## 2017-06-04 MED ORDER — ASPIRIN EC 325 MG PO TBEC: 325.0000 mg | DELAYED_RELEASE_TABLET | Freq: Every day | ORAL | 0 refills | Status: AC

## 2017-06-04 MED ORDER — BUPIVACAINE-EPINEPHRINE (PF) 0.5% -1:200000 IJ SOLN
INTRAMUSCULAR | Status: DC | PRN
  Administered 2017-06-04: 30 mL via PERINEURAL

## 2017-06-04 MED ORDER — FENTANYL CITRATE (PF) 100 MCG/2ML IJ SOLN
INTRAMUSCULAR | Status: AC
  Administered 2017-06-04: 100 ug via INTRAVENOUS
  Filled 2017-06-04: qty 2

## 2017-06-04 MED ORDER — MIDAZOLAM HCL 2 MG/2ML IJ SOLN
2.0000 mg | Freq: Once | INTRAMUSCULAR | Status: AC
  Administered 2017-06-04: 2 mg via INTRAVENOUS

## 2017-06-04 MED ORDER — HYDROMORPHONE HCL 1 MG/ML IJ SOLN
0.2500 mg | INTRAMUSCULAR | Status: DC | PRN
  Administered 2017-06-04: 0.5 mg via INTRAVENOUS

## 2017-06-04 MED ORDER — LACTATED RINGERS IV SOLN: INTRAVENOUS | Status: DC

## 2017-06-04 MED ORDER — CEFAZOLIN SODIUM-DEXTROSE 2-4 GM/100ML-% IV SOLN
2.0000 g | INTRAVENOUS | Status: AC
  Administered 2017-06-04: 2 g via INTRAVENOUS
  Filled 2017-06-04: qty 100

## 2017-06-04 MED ORDER — OXYCODONE-ACETAMINOPHEN 5-325 MG PO TABS
ORAL_TABLET | ORAL | Status: AC
  Administered 2017-06-04: 2
  Filled 2017-06-04: qty 2

## 2017-06-04 MED ORDER — GLYCOPYRROLATE 0.2 MG/ML IJ SOLN
INTRAMUSCULAR | Status: DC | PRN
  Administered 2017-06-04: 0.2 mg via INTRAVENOUS

## 2017-06-04 MED ORDER — DEXAMETHASONE SODIUM PHOSPHATE 10 MG/ML IJ SOLN
INTRAMUSCULAR | Status: DC | PRN
  Administered 2017-06-04: 10 mg via INTRAVENOUS

## 2017-06-04 MED ORDER — PROMETHAZINE HCL 25 MG/ML IJ SOLN: 6.2500 mg | INTRAMUSCULAR | Status: DC | PRN

## 2017-06-04 MED ORDER — MIDAZOLAM HCL 2 MG/2ML IJ SOLN
INTRAMUSCULAR | Status: AC
  Administered 2017-06-04: 2 mg via INTRAVENOUS
  Filled 2017-06-04: qty 2

## 2017-06-04 MED ORDER — 0.9 % SODIUM CHLORIDE (POUR BTL) OPTIME
TOPICAL | Status: DC | PRN
  Administered 2017-06-04: 1000 mL

## 2017-06-04 MED ORDER — CHLORHEXIDINE GLUCONATE 4 % EX LIQD: 60.0000 mL | Freq: Once | CUTANEOUS | Status: DC

## 2017-06-04 MED ORDER — MIDAZOLAM HCL 2 MG/2ML IJ SOLN
INTRAMUSCULAR | Status: AC
  Filled 2017-06-04: qty 2

## 2017-06-04 MED ORDER — FENTANYL CITRATE (PF) 100 MCG/2ML IJ SOLN
100.0000 ug | Freq: Once | INTRAMUSCULAR | Status: AC
  Administered 2017-06-04: 100 ug via INTRAVENOUS

## 2017-06-04 MED ORDER — PROPOFOL 10 MG/ML IV BOLUS
INTRAVENOUS | Status: DC | PRN
  Administered 2017-06-04: 200 mg via INTRAVENOUS

## 2017-06-04 MED ORDER — DEXAMETHASONE SODIUM PHOSPHATE 10 MG/ML IJ SOLN
INTRAMUSCULAR | Status: AC
  Filled 2017-06-04: qty 1

## 2017-06-04 MED ORDER — OXYCODONE-ACETAMINOPHEN 5-325 MG PO TABS: 1.0000 | ORAL_TABLET | Freq: Four times a day (QID) | ORAL | 0 refills | Status: DC | PRN

## 2017-06-04 MED ORDER — SCOPOLAMINE 1 MG/3DAYS TD PT72
1.0000 | MEDICATED_PATCH | TRANSDERMAL | Status: DC
  Administered 2017-06-04: 1.5 mg via TRANSDERMAL

## 2017-06-04 MED ORDER — ONDANSETRON HCL 4 MG/2ML IJ SOLN
INTRAMUSCULAR | Status: DC | PRN
  Administered 2017-06-04: 4 mg via INTRAVENOUS

## 2017-06-04 MED ORDER — LACTATED RINGERS IV SOLN
INTRAVENOUS | Status: DC
  Administered 2017-06-04: 16:00:00 via INTRAVENOUS

## 2017-06-04 MED ORDER — SCOPOLAMINE 1 MG/3DAYS TD PT72
MEDICATED_PATCH | TRANSDERMAL | Status: AC
  Administered 2017-06-04: 1.5 mg via TRANSDERMAL
  Filled 2017-06-04: qty 1

## 2017-06-04 MED ORDER — LIDOCAINE 2% (20 MG/ML) 5 ML SYRINGE
INTRAMUSCULAR | Status: AC
  Filled 2017-06-04: qty 5

## 2017-06-04 MED ORDER — FENTANYL CITRATE (PF) 250 MCG/5ML IJ SOLN
INTRAMUSCULAR | Status: AC
  Filled 2017-06-04: qty 5

## 2017-06-04 MED ORDER — FENTANYL CITRATE (PF) 100 MCG/2ML IJ SOLN
INTRAMUSCULAR | Status: DC | PRN
  Administered 2017-06-04: 50 ug via INTRAVENOUS

## 2017-06-04 MED ORDER — POVIDONE-IODINE 10 % EX SWAB: 2.0000 "application " | Freq: Once | CUTANEOUS | Status: DC

## 2017-06-04 MED ORDER — HYDROMORPHONE HCL 1 MG/ML IJ SOLN
INTRAMUSCULAR | Status: AC
  Administered 2017-06-04: 0.5 mg via INTRAVENOUS
  Filled 2017-06-04: qty 1

## 2017-06-04 SURGICAL SUPPLY — 72 items
BANDAGE ACE 4X5 VEL STRL LF (GAUZE/BANDAGES/DRESSINGS) ×1 IMPLANT
BANDAGE ACE 6X5 VEL STRL LF (GAUZE/BANDAGES/DRESSINGS) ×3 IMPLANT
BANDAGE ESMARK 6X9 LF (GAUZE/BANDAGES/DRESSINGS) ×1 IMPLANT
BLADE CLIPPER SURG (BLADE) ×1 IMPLANT
BNDG CMPR 9X6 STRL LF SNTH (GAUZE/BANDAGES/DRESSINGS) ×1
BNDG COHESIVE 6X5 TAN STRL LF (GAUZE/BANDAGES/DRESSINGS) ×3 IMPLANT
BNDG ESMARK 6X9 LF (GAUZE/BANDAGES/DRESSINGS) ×3
CANISTER SUCTION 2500CC (MISCELLANEOUS) ×2 IMPLANT
COVER MAYO STAND STRL (DRAPES) ×3 IMPLANT
COVER SURGICAL LIGHT HANDLE (MISCELLANEOUS) ×3 IMPLANT
CUFF TOURNIQUET SINGLE 34IN LL (TOURNIQUET CUFF) ×2 IMPLANT
CUFF TOURNIQUET SINGLE 44IN (TOURNIQUET CUFF) IMPLANT
DRAPE C-ARM 42X72 X-RAY (DRAPES) IMPLANT
DRAPE INCISE IOBAN 66X45 STRL (DRAPES) ×2 IMPLANT
DRAPE U-SHAPE 47X51 STRL (DRAPES) ×3 IMPLANT
DRILL BIT 7/64X5 (BIT) ×1 IMPLANT
DRSG ADAPTIC 3X8 NADH LF (GAUZE/BANDAGES/DRESSINGS) ×1 IMPLANT
DRSG AQUACEL AG ADV 3.5X10 (GAUZE/BANDAGES/DRESSINGS) ×2 IMPLANT
DRSG PAD ABDOMINAL 8X10 ST (GAUZE/BANDAGES/DRESSINGS) ×3 IMPLANT
DURAPREP 26ML APPLICATOR (WOUND CARE) ×2 IMPLANT
ELECT REM PT RETURN 9FT ADLT (ELECTROSURGICAL) ×3
ELECTRODE REM PT RTRN 9FT ADLT (ELECTROSURGICAL) ×1 IMPLANT
FACESHIELD WRAPAROUND (MASK) ×3 IMPLANT
FACESHIELD WRAPAROUND OR TEAM (MASK) ×2 IMPLANT
GAUZE SPONGE 4X4 12PLY STRL (GAUZE/BANDAGES/DRESSINGS) ×2 IMPLANT
GAUZE XEROFORM 1X8 LF (GAUZE/BANDAGES/DRESSINGS) ×2 IMPLANT
GLOVE BIOGEL PI IND STRL 8 (GLOVE) ×2 IMPLANT
GLOVE BIOGEL PI INDICATOR 8 (GLOVE) ×4
GLOVE ECLIPSE 7.5 STRL STRAW (GLOVE) ×6 IMPLANT
GOWN STRL REUS W/ TWL LRG LVL3 (GOWN DISPOSABLE) ×1 IMPLANT
GOWN STRL REUS W/ TWL XL LVL3 (GOWN DISPOSABLE) ×2 IMPLANT
GOWN STRL REUS W/TWL LRG LVL3 (GOWN DISPOSABLE) ×6
GOWN STRL REUS W/TWL XL LVL3 (GOWN DISPOSABLE) ×6
K-WIRE PLA 9 .062 (WIRE) ×1 IMPLANT
KIT BASIN OR (CUSTOM PROCEDURE TRAY) ×3 IMPLANT
KIT ROOM TURNOVER OR (KITS) ×3 IMPLANT
MANIFOLD NEPTUNE II (INSTRUMENTS) ×1 IMPLANT
NDL 1/2 CIR CATGUT .05X1.09 (NEEDLE) IMPLANT
NEEDLE 1/2 CIR CATGUT .05X1.09 (NEEDLE) ×3 IMPLANT
NEEDLE 22X1 1/2 (OR ONLY) (NEEDLE) IMPLANT
NS IRRIG 1000ML POUR BTL (IV SOLUTION) ×3 IMPLANT
PACK ORTHO EXTREMITY (CUSTOM PROCEDURE TRAY) ×3 IMPLANT
PAD ARMBOARD 7.5X6 YLW CONV (MISCELLANEOUS) ×6 IMPLANT
PAD CAST 4YDX4 CTTN HI CHSV (CAST SUPPLIES) ×2 IMPLANT
PADDING CAST COTTON 4X4 STRL (CAST SUPPLIES) ×3
PASSER SUT SWANSON 36MM LOOP (INSTRUMENTS) IMPLANT
RETRIEVER SUT HEWSON (MISCELLANEOUS) ×2 IMPLANT
SPONGE LAP 4X18 X RAY DECT (DISPOSABLE) ×2 IMPLANT
STAPLER VISISTAT 35W (STAPLE) ×3 IMPLANT
STOCKINETTE IMPERVIOUS LG (DRAPES) ×3 IMPLANT
SUCTION FRAZIER HANDLE 10FR (MISCELLANEOUS) ×2
SUCTION TUBE FRAZIER 10FR DISP (MISCELLANEOUS) ×1 IMPLANT
SUT ETHIBOND 2 V 37 (SUTURE) IMPLANT
SUT ETHIBOND 5 LR DA (SUTURE) IMPLANT
SUT ETHIBOND NAB CT1 #1 30IN (SUTURE) IMPLANT
SUT FIBERWIRE #2 38 REV NDL BL (SUTURE) ×9
SUT STEEL 7 (SUTURE) IMPLANT
SUT VIC AB 0 CT1 27 (SUTURE) ×6
SUT VIC AB 0 CT1 27XBRD ANBCTR (SUTURE) IMPLANT
SUT VIC AB 1 CT1 27 (SUTURE) ×6
SUT VIC AB 1 CT1 27XBRD ANBCTR (SUTURE) IMPLANT
SUT VIC AB 2-0 CT1 27 (SUTURE) ×3
SUT VIC AB 2-0 CT1 TAPERPNT 27 (SUTURE) IMPLANT
SUTURE FIBERWR#2 38 REV NDL BL (SUTURE) IMPLANT
SYR CONTROL 10ML LL (SYRINGE) IMPLANT
TOWEL OR 17X24 6PK STRL BLUE (TOWEL DISPOSABLE) ×1 IMPLANT
TOWEL OR 17X26 10 PK STRL BLUE (TOWEL DISPOSABLE) ×3 IMPLANT
TUBE CONNECTING 12'X1/4 (SUCTIONS) ×1
TUBE CONNECTING 12X1/4 (SUCTIONS) ×2 IMPLANT
UNDERPAD 30X30 (UNDERPADS AND DIAPERS) ×1 IMPLANT
WATER STERILE IRR 1000ML POUR (IV SOLUTION) ×1 IMPLANT
YANKAUER SUCT BULB TIP NO VENT (SUCTIONS) ×2 IMPLANT

## 2017-06-04 NOTE — H&P (Signed)
PREOPERATIVE H&P  Chief Complaint: Left knee inability to walk and stand with pain.  HPI: Robert Barron is a 67 y.o. male who presents for evaluation of left knee inability to walk and stand.. It has been present for 5 days and has been worsening. He has failed conservative measures. Pain is rated as moderate.  Past Medical History:  Diagnosis Date  . 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 Hypertension   . PONV (postoperative nausea and vomiting)    nausea after dental surgery  . Sleep apnea    cpap setting of 11   Past Surgical History:  Procedure Laterality Date  . COLONOSCOPY WITH PROPOFOL N/A 10/13/2014   Procedure: COLONOSCOPY WITH PROPOFOL;  Surgeon: Charolett BumpersMartin K Johnson, MD;  Location: [facility];  Service: Endoscopy;  Laterality: N/A;  . wisdom teeth extraction and large tori removed     Social History   Socioeconomic History  . Marital status: Married    Spouse name: None  . Number of children: None  . Years of education: None  . Highest education level: None  Social Needs  . Financial resource strain: None  . Food insecurity - worry: None  . Food insecurity - inability: None  . Transportation needs - medical: None  . Transportation needs - non-medical: None  Occupational History  . None  Tobacco Use  . Smoking status: Never Smoker  . Smokeless tobacco: Never Used  Substance and Sexual Activity  . Alcohol use: No  . Drug use: No  . Sexual activity: Yes  Other Topics Concern  . None  Social History Narrative  . None   History reviewed. No pertinent family history. Allergies  Allergen Reactions  . Nitroglycerin Other (See Comments)    Severe headache with patch   Prior to Admission medications   Medication Sig Start Date End Date Taking? Authorizing Provider  acetaminophen (TYLENOL) 500 MG tablet Take 500-1,000 mg by mouth every 6 (six) hours as needed for headache.   Yes [provider]  amLODipine (NORVASC) 5 MG tablet Take 5 mg by  mouth daily.   Yes [provider]  meloxicam (MOBIC) 15 MG tablet Take 1 tablet (15 mg total) by mouth daily as needed. Patient taking differently: Take 15 mg by mouth daily as needed for pain.  05/19/16  Yes Nestor RampNeal, Sara L, MD     Positive ROS: None  All other systems have been reviewed and were otherwise negative with the exception of those mentioned in the HPI and as above.  Physical Exam: Vitals:   06/04/17 1600 06/04/17 1605  BP: (!) 151/80 135/69  Pulse: (!) 54 (!) 54  Resp: (!) 9 14  Temp:    SpO2: 97% 94%    General: Alert, no acute distress Cardiovascular: No pedal edema Respiratory: No cyanosis, no use of accessory musculature GI: No organomegaly, abdomen is soft and non-tender Skin: No lesions in the area of chief complaint Neurologic: Sensation intact distally Psychiatric: Patient is competent for consent with normal mood and affect Lymphatic: No axillary or cervical lymphadenopathy  MUSCULOSKELETAL: Left knee: Limited range of motion.  Inability to do straight leg raise.  Palpable gap between the patella and the patellar tendon.  Assessment/Plan: LEFT PATELLA TENDON RUPTURE Plan for Procedure(s): PATELLA TENDON REPAIR  The risks benefits and alternatives were discussed with the patient including but not limited to the risks of nonoperative treatment, versus surgical intervention including infection, bleeding, nerve injury, malunion, nonunion, hardware prominence, hardware failure,  need for hardware removal, blood clots, cardiopulmonary complications, morbidity, mortality, among others, and they were willing to proceed.  Predicted outcome is good, although there will be at least a six to nine month expected recovery.  Harvie Junior, MD 06/04/2017 4:16 PM

## 2017-06-04 NOTE — Brief Op Note (Signed)
06/04/2017  5:39 PM  PATIENT:  Robert Barron  67 y.o. male  PRE-OPERATIVE DIAGNOSIS:  LEFT PATELLA TENDON RUPTURE  POST-OPERATIVE DIAGNOSIS:  LEFT PATELLA TENDON RUPTURE  PROCEDURE:  Procedure(s): PATELLA TENDON REPAIR (Left)  SURGEON:  Surgeon(s) and Role:    Jodi Geralds* Lillyauna Jenkinson, MD - Primary  PHYSICIAN ASSISTANT:   ASSISTANTS: bethune   ANESTHESIA:   general  EBL:  none   BLOOD ADMINISTERED:none  DRAINS: none   LOCAL MEDICATIONS USED:  MARCAINE     SPECIMEN:  No Specimen  DISPOSITION OF SPECIMEN:  N/A  COUNTS:  YES  TOURNIQUET:  * Missing tourniquet times found for documented tourniquets in log: [id number] *  DICTATION: .Other Dictation: Dictation Number [id number]  PLAN OF CARE: Discharge to home after PACU  PATIENT DISPOSITION:  PACU - hemodynamically stable.   Delay start of Pharmacological VTE agent (>24hrs) due to surgical blood loss or risk of bleeding: no

## 2017-06-04 NOTE — OR Nursing (Signed)
Dilaudid 0.5mg  IV wasted in sink after discharge.  Witnessed with Adan SisSookie Yu RN.

## 2017-06-04 NOTE — Anesthesia Procedure Notes (Signed)
Anesthesia Regional Block: Adductor canal block   Pre-Anesthetic Checklist: ,, timeout performed, Correct Patient, Correct Site, Correct Laterality, Correct Procedure, Correct Position, site marked, Risks and benefits discussed,  Surgical consent,  Pre-op evaluation,  At surgeon's request and post-op pain management  Laterality: Left  Prep: chloraprep       Needles:  Injection technique: Single-shot  Needle Type: Stimulator Needle - 80     Needle Length: 10cm  Needle Gauge: 21     Additional Needles:   Narrative:  Start time: 06/04/2017 3:56 PM End time: 06/04/2017 4:06 PM Injection made incrementally with aspirations every 5 mL.  Performed by: Personally

## 2017-06-04 NOTE — Anesthesia Preprocedure Evaluation (Addendum)
Anesthesia Evaluation  Patient identified by MRN, date of birth, ID band Patient awake    Reviewed: Allergy & Precautions, NPO status , Patient's Chart, lab work & pertinent test results  History of Anesthesia Complications (+) PONV and history of anesthetic complications  Airway Mallampati: II  TM Distance: >3 FB Neck ROM: Full    Dental  (+) Teeth Intact, Dental Advisory Given   Pulmonary sleep apnea ,    breath sounds clear to auscultation       Cardiovascular hypertension,  Rhythm:Regular Rate:Normal     Neuro/Psych negative neurological ROS  negative psych ROS   GI/Hepatic negative GI ROS, Neg liver ROS,   Endo/Other  negative endocrine ROS  Renal/GU negative Renal ROS     Musculoskeletal   Abdominal   Peds  Hematology   Anesthesia Other Findings   Reproductive/Obstetrics                            Anesthesia Physical Anesthesia Plan  ASA: II  Anesthesia Plan: General   Post-op Pain Management: GA combined w/ Regional for post-op pain   Induction: Intravenous  PONV Risk Score and Plan: 3 and Ondansetron, Dexamethasone and Scopolamine patch - Pre-op  Airway Management Planned: LMA  Additional Equipment:   Intra-op Plan:   Post-operative Plan: Extubation in OR  Informed Consent: I have reviewed the patients History and Physical, chart, labs and discussed the procedure including the risks, benefits and alternatives for the proposed anesthesia with the patient or authorized representative who has indicated his/her understanding and acceptance.   Dental advisory given  Plan Discussed with: CRNA, Anesthesiologist and Surgeon  Anesthesia Plan Comments:         Anesthesia Quick Evaluation

## 2017-06-04 NOTE — Anesthesia Procedure Notes (Signed)
Procedure Name: LMA Insertion Date/Time: 06/04/2017 4:28 PM Performed by: Lovie Cholock, Dantrell Schertzer K, CRNA Pre-anesthesia Checklist: Patient identified, Emergency Drugs available, Suction available and Patient being monitored Patient Re-evaluated:Patient Re-evaluated prior to induction Oxygen Delivery Method: Circle System Utilized Preoxygenation: Pre-oxygenation with 100% oxygen Induction Type: IV induction Ventilation: Mask ventilation without difficulty LMA: LMA inserted LMA Size: 5.0 Number of attempts: 1 Airway Equipment and Method: Bite block Placement Confirmation: positive ETCO2 and breath sounds checked- equal and bilateral Tube secured with: Tape Dental Injury: Teeth and Oropharynx as per pre-operative assessment

## 2017-06-04 NOTE — Discharge Instructions (Signed)
Ambulate weightbearing as tolerated on the left lower extremity with crutches or a walker. Wear knee immobilizer at all times.  You may loosen it or remove it when sitting down with your leg elevated. Take 1 enteric-coated 325 mg aspirin daily times 1 month postop to decrease risk of a blood clot.   General Anesthesia, Adult, Care After These instructions provide you with information about caring for yourself after your procedure. Your health care provider may also give you more specific instructions. Your treatment has been planned according to current medical practices, but problems sometimes occur. Call your health care provider if you have any problems or questions after your procedure. What can I expect after the procedure? After the procedure, it is common to have:  Vomiting.  A sore throat.  Mental slowness.  It is common to feel:  Nauseous.  Cold or shivery.  Sleepy.  Tired.  Sore or achy, even in parts of your body where you did not have surgery.  Follow these instructions at home: For at least 24 hours after the procedure:  Do not: ? Participate in activities where you could fall or become injured. ? Drive. ? Use heavy machinery. ? Drink alcohol. ? Take sleeping pills or medicines that cause drowsiness. ? Make important decisions or sign legal documents. ? Take care of children on your own.  Rest. Eating and drinking  If you vomit, drink water, juice, or soup when you can drink without vomiting.  Drink enough fluid to keep your urine clear or pale yellow.  Make sure you have little or no nausea before eating solid foods.  Follow the diet recommended by your health care provider. General instructions  Have a responsible adult stay with you until you are awake and alert.  Return to your normal activities as told by your health care provider. Ask your health care provider what activities are safe for you.  Take over-the-counter and prescription medicines  only as told by your health care provider.  If you smoke, do not smoke without supervision.  Keep all follow-up visits as told by your health care provider. This is important. Contact a health care provider if:  You continue to have nausea or vomiting at home, and medicines are not helpful.  You cannot drink fluids or start eating again.  You cannot urinate after 8-12 hours.  You develop a skin rash.  You have fever.  You have increasing redness at the site of your procedure. Get help right away if:  You have difficulty breathing.  You have chest pain.  You have unexpected bleeding.  You feel that you are having a life-threatening or urgent problem. This information is not intended to replace advice given to you by your health care provider. Make sure you discuss any questions you have with your health care provider. Document Released: 07/24/2000 Document Revised: 09/20/2015 Document Reviewed: 04/01/2015 Elsevier Interactive Patient Education  [employer]2018 [employer]

## 2017-06-04 NOTE — Transfer of Care (Signed)
Immediate Anesthesia Transfer of Care Note  Patient: Robert Barron  Procedure(s) Performed: PATELLA TENDON REPAIR (Left Knee)  Patient Location: PACU  Anesthesia Type:GA combined with regional for post-op pain  Level of Consciousness: awake, alert  and oriented  Airway & Oxygen Therapy: Patient Spontanous Breathing and Patient connected to face mask oxygen  Post-op Assessment: Report given to RN and Post -op Vital signs reviewed and stable  Post vital signs: Reviewed and stable  Last Vitals:  Vitals:   06/04/17 1600 06/04/17 1605  BP: (!) 151/80 135/69  Pulse: (!) 54 (!) 54  Resp: (!) 9 14  Temp:    SpO2: 97% 94%    Last Pain:  Vitals:   06/04/17 1538  TempSrc: Oral      Patients Stated Pain Goal: 6 (06/04/17 1537)  Complications: No apparent anesthesia complications

## 2017-06-05 ENCOUNTER — Encounter (HOSPITAL_COMMUNITY): Payer: Self-pay | Admitting: Orthopedic Surgery

## 2017-06-05 NOTE — Anesthesia Postprocedure Evaluation (Signed)
Anesthesia Post Note  Patient: Robert Barron  Procedure(s) Performed: PATELLA TENDON REPAIR (Left Knee)     Patient location during evaluation: PACU Anesthesia Type: General and Regional Level of consciousness: awake and alert Pain management: pain level controlled Vital Signs Assessment: post-procedure vital signs reviewed and stable Respiratory status: spontaneous breathing, nonlabored ventilation, respiratory function stable and patient connected to nasal cannula oxygen Cardiovascular status: blood pressure returned to baseline and stable Postop Assessment: no apparent nausea or vomiting Anesthetic complications: no    Last Vitals:  Vitals:   06/04/17 1924 06/04/17 1930  BP: 135/81   Pulse: 60 (!) 55  Resp: 14 11  Temp:    SpO2: 93% 95%    Last Pain:  Vitals:   06/04/17 1930  TempSrc:   PainSc: 1    Pain Goal: Patients Stated Pain Goal: 6 (06/04/17 1537)               Ryan P Ellender

## 2017-06-05 NOTE — Op Note (Signed)
NAME:  Robert Barron, Robert Barron NO.:  MEDICAL RECORD NO.:  [id number]  LOCATION:                                 FACILITY:  PHYSICIAN:  Robert Barron, M.D.        DATE OF BIRTH:  DATE OF PROCEDURE:  06/04/2017 DATE OF DISCHARGE:                              OPERATIVE REPORT   PREOPERATIVE DIAGNOSIS:  Patellar tendon rupture, left.  POSTOPERATIVE DIAGNOSIS:  Patellar tendon rupture, left.  PRINCIPAL PROCEDURE:  Left patellar tendon repair.  SURGEON:  Robert Barron, M.D.  ASSISTANT:  Marshia Ly, P.A.  ANESTHESIA:  General.  BRIEF HISTORY:  Robert Barron is a 67 year old male with a long history of tripping on a golf course.  He suffered a knee injury and was seen in our Urgent Care.  Urgent Care x-ray showed a high-riding patellar tendon and examination showed a large patellar tendon defect.  We evaluated him in the office and confirmed these, felt that he had a patellar tendon disruption.  We talked about treatment options.  We felt that he needed urgent fixation of this patellar tendon disruption.  He was brought to the operating room for this procedure.  DESCRIPTION OF PROCEDURE:  The patient was brought to the operating room.  After adequate anesthesia was obtained with general anesthetic, the patient was placed supine on the operating table.  The left leg was then prepped and draped in usual sterile fashion.  Following this, the leg was exsanguinated.  Blood pressure tourniquet was inflated to 300 mmHg.  Following this, an incision was made from the superior pole of the patella distally to the inferior pole of the insertion of the patellar tendon.  At this point, given his large size, I felt that 3 FiberWires would be appropriate.  We did 3 FiberWires in a Bunnell-type stitch through the central portion of the tendon, medial portion of the tendon, and lateral portion of the tendon.  Once this was completed, at this point, we did 4 drill holes  through the patella and then sent the tagged pairs of stitches up through the holes in the patella, creating 3 knots of suture on the superior portion of patella.  Happily, at this point, I can get them almost to 90 degrees without significant tension on the patellar tendon.  Once the patellar tendon had been repaired, we went to the retinaculum.  The retinaculum was completely torn from medial all the way around to the lateral.  So, at this point, we took #1 Vicryl and repaired the patellar retinaculum on the medial side and on the lateral side.  At that point, we took a #1 Vicryl and over sewed the patellar tendon to the remnant of the patellar tendon on the top of the patella.  At this point, the tissue for the bursa was closed over top of the repair and then the skin was closed with 0 and 2-0 Vicryl, and 3-0 Monocryl subcuticular.  Benzoin and Steri-Strips were applied. Sterile compressive dressing was applied.  The patient was taken to the recovery room and was noted to be in satisfactory condition.  Estimated blood loss for the procedure was minimal.  Robert JuniorJohn L. Gola Barron, M.D.     Ranae PlumberJLG/MEDQ  D:  06/04/2017  T:  06/04/2017  Job:  [id number]  cc:   Robert JuniorJohn L. [name], M.D.

## 2017-06-27 MED FILL — DESONIDE 0.05% CREAM: 0.05 | 14 days supply | Qty: 60 | Fill #0

## 2017-07-02 MED FILL — METHOCARBAMOL 750 MG TABS: 750 | 13 days supply | Qty: 40 | Fill #0

## 2017-07-25 MED FILL — AMLODIPINE BESYLATE 5 MG TA: 5 | 90 days supply | Qty: 90 | Fill #0

## 2017-10-25 MED FILL — AMLODIPINE BESYLATE 5 MG TA: 5 | 90 days supply | Qty: 90 | Fill #1

## 2018-01-24 MED FILL — AMLODIPINE BESYLATE 5 MG TA: 5 | 90 days supply | Qty: 90 | Fill #2

## 2018-07-01 DIAGNOSIS — Z0000 Encounter for general adult medical examination without abnormal findings: Secondary | ICD-10-CM | POA: Diagnosis not present

## 2018-07-01 DIAGNOSIS — E7800 Pure hypercholesterolemia, unspecified: Secondary | ICD-10-CM | POA: Diagnosis not present

## 2018-07-01 DIAGNOSIS — Z1159 Encounter for screening for other viral diseases: Secondary | ICD-10-CM | POA: Diagnosis not present

## 2018-07-01 DIAGNOSIS — I10 Essential (primary) hypertension: Secondary | ICD-10-CM | POA: Diagnosis not present

## 2018-07-01 DIAGNOSIS — Z23 Encounter for immunization: Secondary | ICD-10-CM | POA: Diagnosis not present

## 2018-07-01 DIAGNOSIS — Z789 Other specified health status: Secondary | ICD-10-CM | POA: Diagnosis not present

## 2018-07-01 DIAGNOSIS — E669 Obesity, unspecified: Secondary | ICD-10-CM | POA: Diagnosis not present

## 2018-07-01 DIAGNOSIS — Z6833 Body mass index (BMI) 33.0-33.9, adult: Secondary | ICD-10-CM | POA: Diagnosis not present

## 2018-07-01 DIAGNOSIS — Z1389 Encounter for screening for other disorder: Secondary | ICD-10-CM | POA: Diagnosis not present

## 2018-07-01 DIAGNOSIS — Z0184 Encounter for antibody response examination: Secondary | ICD-10-CM | POA: Diagnosis not present

## 2018-07-01 DIAGNOSIS — R739 Hyperglycemia, unspecified: Secondary | ICD-10-CM | POA: Diagnosis not present

## 2018-07-01 DIAGNOSIS — Z125 Encounter for screening for malignant neoplasm of prostate: Secondary | ICD-10-CM | POA: Diagnosis not present

## 2019-01-08 DIAGNOSIS — R7301 Impaired fasting glucose: Secondary | ICD-10-CM | POA: Diagnosis not present

## 2019-01-08 DIAGNOSIS — I10 Essential (primary) hypertension: Secondary | ICD-10-CM | POA: Diagnosis not present

## 2019-06-08 ENCOUNTER — Ambulatory Visit: Payer: PPO | Attending: Internal Medicine

## 2019-06-08 DIAGNOSIS — Z23 Encounter for immunization: Secondary | ICD-10-CM

## 2019-06-08 NOTE — Progress Notes (Signed)
   Covid-19 Vaccination Clinic  Name:  Robert Barron    MRN: [id number] DOB: [birth year]  06/08/2019  [name] was observed post Covid-19 immunization for 15 minutes without incidence. He was provided with Vaccine Information Sheet and instruction to access the V-Safe system.   [name] was instructed to call 911 with any severe reactions post vaccine: Marland Kitchen Difficulty breathing  . Swelling of your face and throat  . A fast heartbeat  . A bad rash all over your body  . Dizziness and weakness    Immunizations Administered    Name Date Dose VIS Date Route   Pfizer COVID-19 Vaccine 06/08/2019  3:14 PM 0.3 mL 04/11/2019 Intramuscular   Manufacturer: [employer], [employer]   Lot: [id number]   NDC: [address]-9

## 2019-06-29 ENCOUNTER — Ambulatory Visit: Payer: No Typology Code available for payment source

## 2019-07-02 ENCOUNTER — Ambulatory Visit: Payer: PPO

## 2019-07-02 ENCOUNTER — Ambulatory Visit: Payer: PPO | Attending: Internal Medicine

## 2019-07-02 DIAGNOSIS — Z23 Encounter for immunization: Secondary | ICD-10-CM

## 2019-07-02 NOTE — Progress Notes (Signed)
   Covid-19 Vaccination Clinic  Name:  Robert Barron    MRN: [id number] DOB: 10-01-1950  07/02/2019  Robert Barron was observed post Covid-19 immunization for 15 minutes without incident. He was provided with Vaccine Information Sheet and instruction to access the V-Safe system.   Robert Barron was instructed to call 911 with any severe reactions post vaccine: Marland Kitchen Difficulty breathing  . Swelling of face and throat  . A fast heartbeat  . A bad rash all over body  . Dizziness and weakness   Immunizations Administered    Name Date Dose VIS Date Route   Pfizer COVID-19 Vaccine 07/02/2019  3:04 PM 0.3 mL 04/11/2019 Intramuscular   Manufacturer: [employer], [employer]   Lot: [id number]   NDC: [address]-7

## 2019-07-03 DIAGNOSIS — E7800 Pure hypercholesterolemia, unspecified: Secondary | ICD-10-CM | POA: Diagnosis not present

## 2019-07-03 DIAGNOSIS — I10 Essential (primary) hypertension: Secondary | ICD-10-CM | POA: Diagnosis not present

## 2019-07-03 DIAGNOSIS — H9313 Tinnitus, bilateral: Secondary | ICD-10-CM | POA: Diagnosis not present

## 2019-07-03 DIAGNOSIS — R7301 Impaired fasting glucose: Secondary | ICD-10-CM | POA: Diagnosis not present

## 2019-07-03 DIAGNOSIS — Z1389 Encounter for screening for other disorder: Secondary | ICD-10-CM | POA: Diagnosis not present

## 2019-07-03 DIAGNOSIS — Z0000 Encounter for general adult medical examination without abnormal findings: Secondary | ICD-10-CM | POA: Diagnosis not present

## 2019-12-31 DIAGNOSIS — I10 Essential (primary) hypertension: Secondary | ICD-10-CM | POA: Diagnosis not present

## 2020-02-12 ENCOUNTER — Ambulatory Visit: Payer: PPO | Attending: Internal Medicine

## 2020-02-12 DIAGNOSIS — Z23 Encounter for immunization: Secondary | ICD-10-CM

## 2020-02-12 NOTE — Progress Notes (Signed)
   Covid-19 Vaccination Clinic  Name:  ABHIRAJ DOZAL    MRN: [id number] DOB: [birth year]  02/12/2020  Mr. Cappelletti was observed post Covid-19 immunization for 15 minutes without incident. He was provided with Vaccine Information Sheet and instruction to access the V-Safe system.   Mr. Purdum was instructed to call 911 with any severe reactions post vaccine: Marland Kitchen Difficulty breathing  . Swelling of face and throat  . A fast heartbeat  . A bad rash all over body  . Dizziness and weakness

## 2020-03-02 DIAGNOSIS — M25561 Pain in right knee: Secondary | ICD-10-CM | POA: Diagnosis not present

## 2020-06-29 DIAGNOSIS — M25561 Pain in right knee: Secondary | ICD-10-CM | POA: Diagnosis not present

## 2020-06-30 ENCOUNTER — Other Ambulatory Visit: Payer: Self-pay | Admitting: Orthopedic Surgery

## 2020-06-30 DIAGNOSIS — M25561 Pain in right knee: Secondary | ICD-10-CM

## 2020-07-06 DIAGNOSIS — R609 Edema, unspecified: Secondary | ICD-10-CM | POA: Diagnosis not present

## 2020-07-06 DIAGNOSIS — G4733 Obstructive sleep apnea (adult) (pediatric): Secondary | ICD-10-CM | POA: Diagnosis not present

## 2020-07-06 DIAGNOSIS — Z1389 Encounter for screening for other disorder: Secondary | ICD-10-CM | POA: Diagnosis not present

## 2020-07-06 DIAGNOSIS — Z0001 Encounter for general adult medical examination with abnormal findings: Secondary | ICD-10-CM | POA: Diagnosis not present

## 2020-07-06 DIAGNOSIS — I10 Essential (primary) hypertension: Secondary | ICD-10-CM | POA: Diagnosis not present

## 2020-07-06 DIAGNOSIS — R7309 Other abnormal glucose: Secondary | ICD-10-CM | POA: Diagnosis not present

## 2020-07-06 DIAGNOSIS — Z125 Encounter for screening for malignant neoplasm of prostate: Secondary | ICD-10-CM | POA: Diagnosis not present

## 2020-07-06 DIAGNOSIS — E7800 Pure hypercholesterolemia, unspecified: Secondary | ICD-10-CM | POA: Diagnosis not present

## 2020-07-22 ENCOUNTER — Ambulatory Visit
Admission: RE | Admit: 2020-07-22 | Discharge: 2020-07-22 | Disposition: A | Discharge status: Home / Self Care | Payer: PPO | Source: Ambulatory Visit | Attending: Orthopedic Surgery | Admitting: Orthopedic Surgery

## 2020-07-22 DIAGNOSIS — M25561 Pain in right knee: Secondary | ICD-10-CM | POA: Diagnosis not present

## 2020-07-27 DIAGNOSIS — S83241A Other tear of medial meniscus, current injury, right knee, initial encounter: Secondary | ICD-10-CM | POA: Diagnosis not present

## 2020-07-27 DIAGNOSIS — M25561 Pain in right knee: Secondary | ICD-10-CM | POA: Diagnosis not present

## 2020-07-29 DIAGNOSIS — M19071 Primary osteoarthritis, right ankle and foot: Secondary | ICD-10-CM | POA: Diagnosis not present

## 2020-07-29 DIAGNOSIS — M19072 Primary osteoarthritis, left ankle and foot: Secondary | ICD-10-CM | POA: Diagnosis not present

## 2020-08-11 DIAGNOSIS — M19071 Primary osteoarthritis, right ankle and foot: Secondary | ICD-10-CM | POA: Diagnosis not present

## 2020-08-11 DIAGNOSIS — M19072 Primary osteoarthritis, left ankle and foot: Secondary | ICD-10-CM | POA: Diagnosis not present

## 2020-08-18 DIAGNOSIS — M2341 Loose body in knee, right knee: Secondary | ICD-10-CM | POA: Diagnosis not present

## 2020-08-18 DIAGNOSIS — M6751 Plica syndrome, right knee: Secondary | ICD-10-CM | POA: Diagnosis not present

## 2020-08-18 DIAGNOSIS — M94261 Chondromalacia, right knee: Secondary | ICD-10-CM | POA: Diagnosis not present

## 2020-08-18 DIAGNOSIS — Y999 Unspecified external cause status: Secondary | ICD-10-CM | POA: Diagnosis not present

## 2020-08-18 DIAGNOSIS — G8918 Other acute postprocedural pain: Secondary | ICD-10-CM | POA: Diagnosis not present

## 2020-08-18 DIAGNOSIS — S83231A Complex tear of medial meniscus, current injury, right knee, initial encounter: Secondary | ICD-10-CM | POA: Diagnosis not present

## 2020-08-24 DIAGNOSIS — Z9889 Other specified postprocedural states: Secondary | ICD-10-CM | POA: Diagnosis not present

## 2020-08-24 DIAGNOSIS — M25561 Pain in right knee: Secondary | ICD-10-CM | POA: Diagnosis not present

## 2020-09-14 DIAGNOSIS — Z9889 Other specified postprocedural states: Secondary | ICD-10-CM | POA: Diagnosis not present

## 2020-09-21 DIAGNOSIS — M19071 Primary osteoarthritis, right ankle and foot: Secondary | ICD-10-CM | POA: Diagnosis not present

## 2020-09-21 DIAGNOSIS — M722 Plantar fascial fibromatosis: Secondary | ICD-10-CM | POA: Diagnosis not present

## 2020-09-21 DIAGNOSIS — M19072 Primary osteoarthritis, left ankle and foot: Secondary | ICD-10-CM | POA: Diagnosis not present

## 2020-10-12 DIAGNOSIS — Z9889 Other specified postprocedural states: Secondary | ICD-10-CM | POA: Diagnosis not present

## 2020-11-09 DIAGNOSIS — Z9889 Other specified postprocedural states: Secondary | ICD-10-CM | POA: Diagnosis not present

## 2021-01-07 DIAGNOSIS — I10 Essential (primary) hypertension: Secondary | ICD-10-CM | POA: Diagnosis not present

## 2021-01-07 DIAGNOSIS — R7301 Impaired fasting glucose: Secondary | ICD-10-CM | POA: Diagnosis not present

## 2021-01-07 DIAGNOSIS — E7800 Pure hypercholesterolemia, unspecified: Secondary | ICD-10-CM | POA: Diagnosis not present

## 2021-03-28 DIAGNOSIS — J0100 Acute maxillary sinusitis, unspecified: Secondary | ICD-10-CM | POA: Diagnosis not present

## 2021-06-22 ENCOUNTER — Ambulatory Visit
Admission: EM | Admit: 2021-06-22 | Discharge: 2021-06-22 | Disposition: A | Discharge status: Home / Self Care | Payer: PPO | Attending: Family Medicine | Admitting: Family Medicine

## 2021-06-22 ENCOUNTER — Other Ambulatory Visit: Payer: Self-pay

## 2021-06-22 DIAGNOSIS — H9201 Otalgia, right ear: Secondary | ICD-10-CM

## 2021-06-22 DIAGNOSIS — H6121 Impacted cerumen, right ear: Secondary | ICD-10-CM | POA: Diagnosis not present

## 2021-06-22 NOTE — ED Triage Notes (Signed)
Pt reports right ear feels clogged and hearing decreased x 2 days.

## 2021-06-22 NOTE — ED Provider Notes (Signed)
°  [place]   [id number] 06/22/21 Arrival Time: 0831  ASSESSMENT & PLAN:  1. Acute otalgia, right   2. Impacted cerumen of right ear    No signs of infection. Reports improvement after flushing ear. See AVS for d/c information. May f/u with PCP or here as needed.  Reviewed expectations re: course of current medical issues. Questions answered. Outlined signs and symptoms indicating need for more acute intervention. Patient verbalized understanding. After Visit Summary given.   SUBJECTIVE: History from: patient.  Robert Barron is a 71 y.o. male who presents with complaint of right otalgia; without drainage; without bleeding. Onset gradual,  past sev days . Recent cold symptoms: none. Fever: no. Overall normal PO intake without n/v. Sick contacts: no. OTC treatment: gtts without any relief.  Social History   Tobacco Use  Smoking Status Never  Smokeless Tobacco Never    OBJECTIVE:  Vitals:   06/22/21 1018  BP: (!) 143/68  Pulse: (!) 58  Resp: 18  Temp: 97.8 F (36.6 C)  TempSrc: Oral  SpO2: 95%     General appearance: alert Ear Canal: cerumen on the right TM: right: appears normal (after flushing cerumen) Neck: supple without LAD Lungs: unlabored respirations, symmetrical air entry; no respiratory distress Skin: warm and dry Psychological: alert and cooperative; normal mood and affect  Allergies  Allergen Reactions   Nitroglycerin Other (See Comments)    Severe headache with patch    Past Medical History:  Diagnosis Date   Hepatitis as toddler   not sure what kind   Hypertension    PONV (postoperative nausea and vomiting)    nausea after dental surgery   Sleep apnea    cpap setting of 11   History reviewed. No pertinent family history. Social History   Socioeconomic History   Marital status: Married    Spouse name: Not on file   Number of children: Not on file   Years of education: Not on file   Highest education level: Not on  file  Occupational History   Not on file  Tobacco Use   Smoking status: Never   Smokeless tobacco: Never  Substance and Sexual Activity   Alcohol use: No   Drug use: No   Sexual activity: Yes  Other Topics Concern   Not on file  Social History Narrative   Not on file   Social Determinants of Health   Financial Resource Strain: Not on file  Food Insecurity: Not on file  Transportation Needs: Not on file  Physical Activity: Not on file  Stress: Not on file  Social Connections: Not on file  Intimate Partner Violence: Not on file             Vanessa Kick, MD 06/22/21 1258

## 2021-06-26 IMAGING — MR MR KNEE*R* W/O CM
6 series · 40 of 40 positions shown · non-contrast
Comparison: Plain films right knee 08/20/2013.

CLINICAL DATA: Anterior right knee pain and swelling. No recent
injury.

EXAM:
MRI OF THE RIGHT KNEE WITHOUT CONTRAST
TECHNIQUE: Multiplanar, multisequence MR imaging of the knee was performed. No
intravenous contrast was administered.

[Series 10: T2 fat-sat · axial · right · 4.0mm · 0.50mm/px · z∈[-15,+138]mm · 8 of 36 slices shown (1 of 3)]
[im 1/36]
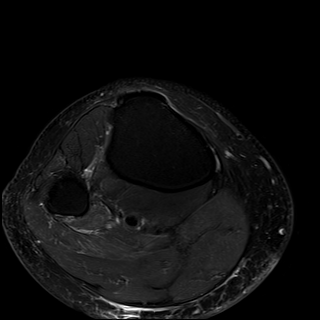
[im 6/36]
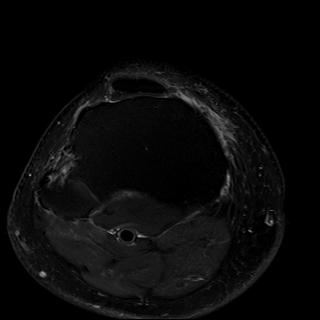
[im 11/36]
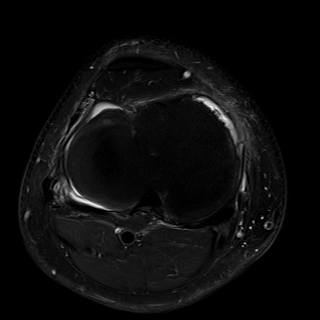
[im 16/36]
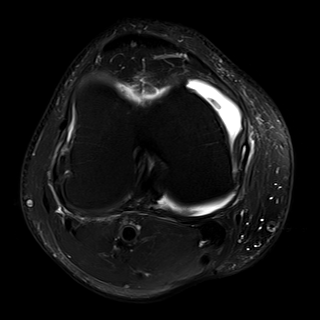
[im 21/36]
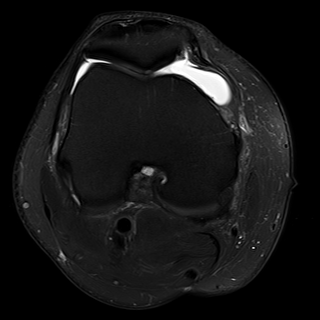
[im 26/36]
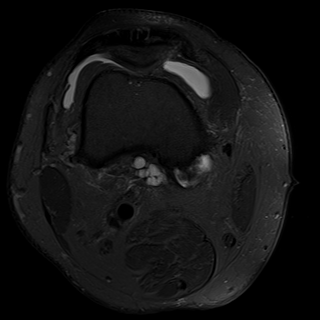
[im 31/36]
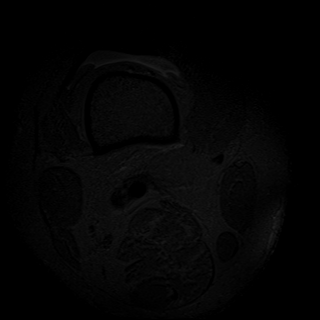
[im 36/36]
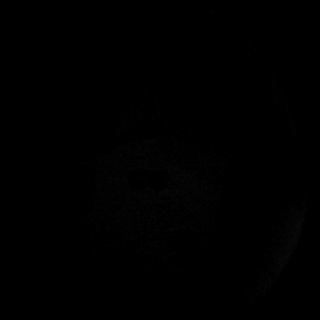

[Series 11: T2 fat-sat · coronal · right · 4.0mm · 0.47mm/px · 6 of 28 slices shown (2 of 3)]
[im 1/28]
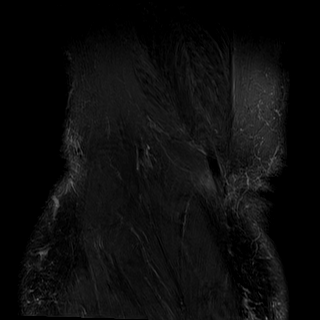
[im 6/28]
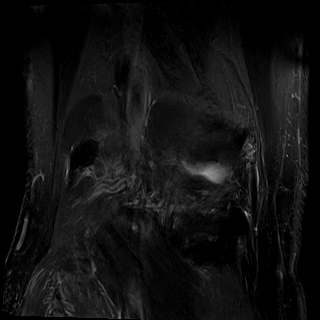
[im 11/28]
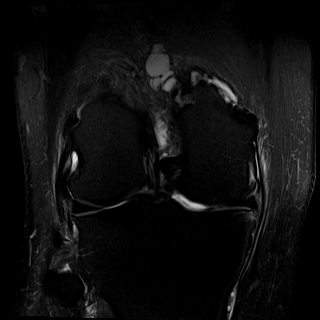
[im 17/28]
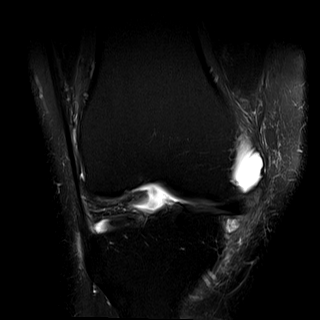
[im 22/28]
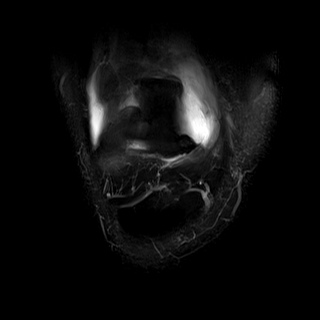
[im 28/28]
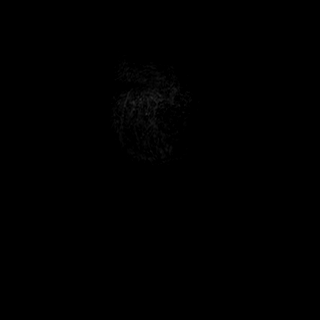

[Series 12: T1 · coronal · right · 4.0mm · 0.47mm/px · 6 of 28 slices shown]
[im 1/28]
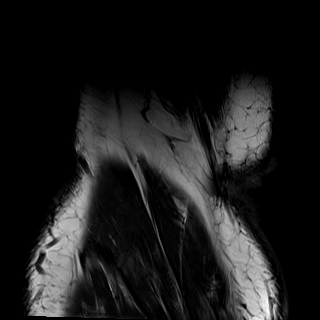
[im 6/28]
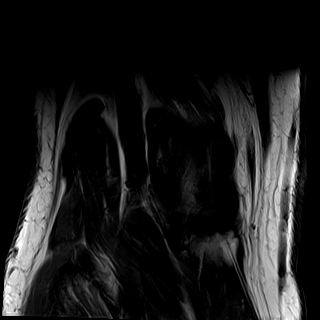
[im 11/28]
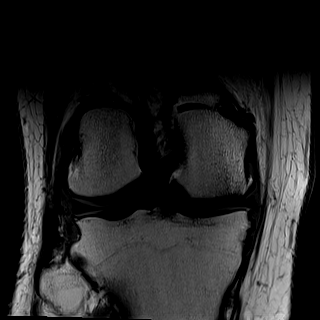
[im 17/28]
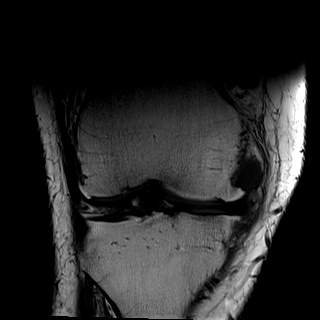
[im 22/28]
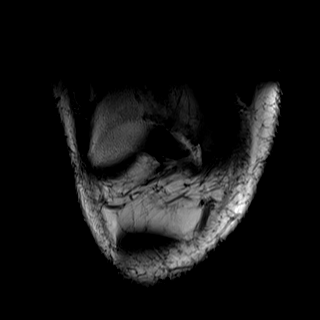
[im 28/28]
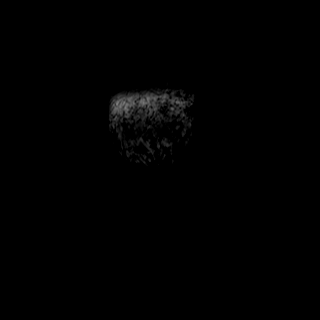

[Series 13: PD fat-sat · coronal · right · 3.0mm · 0.47mm/px · 6 of 28 slices shown (1 of 2)]
[im 1/28]
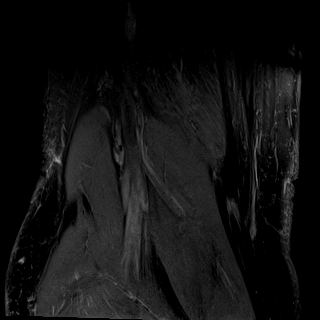
[im 6/28]
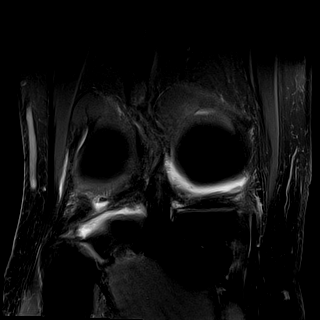
[im 11/28]
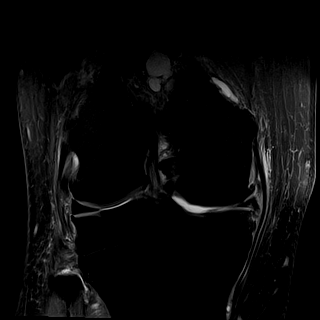
[im 17/28]
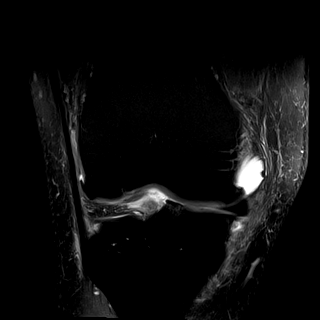
[im 22/28]
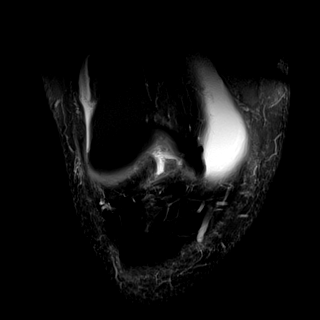
[im 28/28]
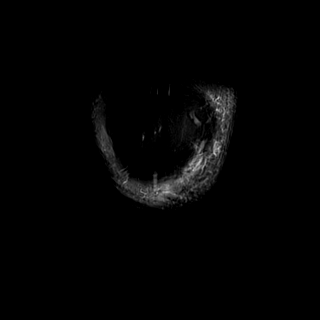

[Series 14: PD fat-sat · sagittal · right · 3.0mm · 0.39mm/px · 7 of 30 slices shown (2 of 2)]
[im 1/30]
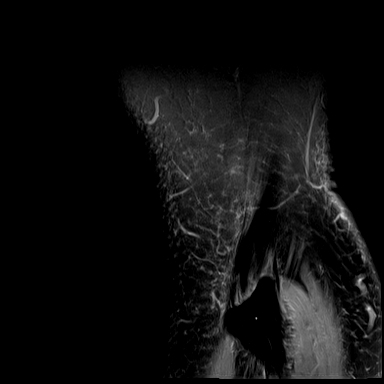
[im 5/30]
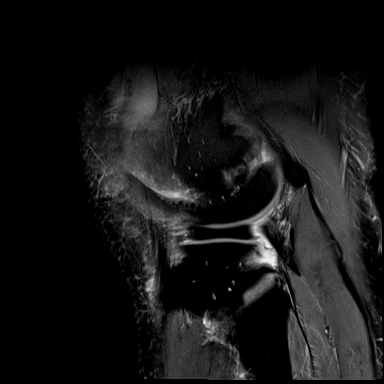
[im 10/30]
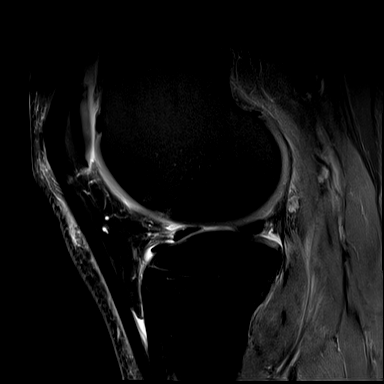
[im 15/30]
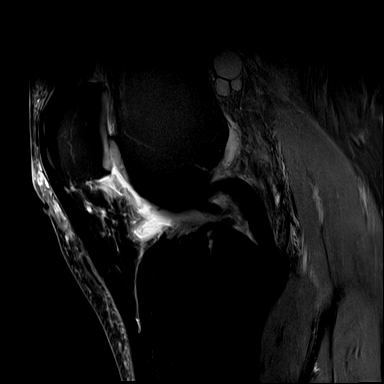
[im 20/30]
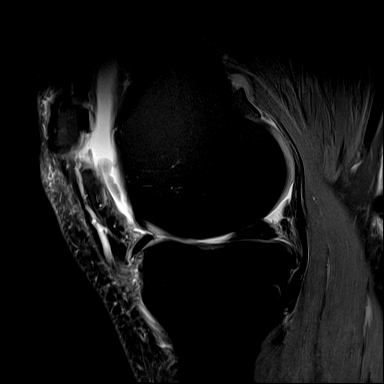
[im 25/30]
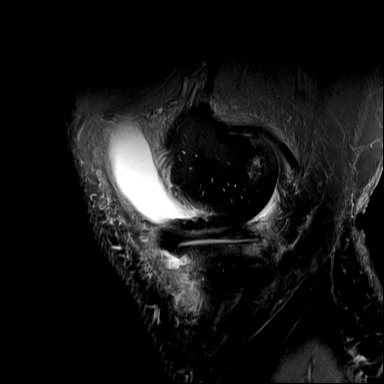
[im 30/30]
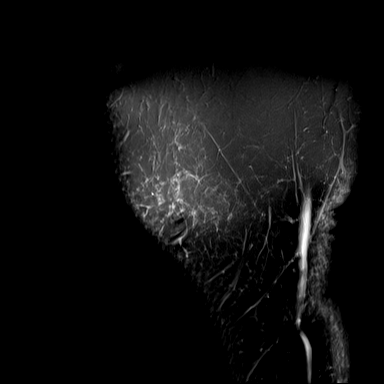

[Series 15: T2 fat-sat · sagittal · right · 3.0mm · 0.39mm/px · 7 of 30 slices shown (3 of 3)]
[im 1/30]
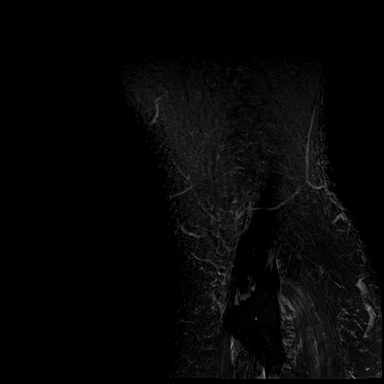
[im 5/30]
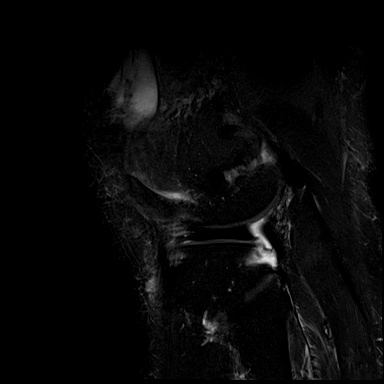
[im 10/30]
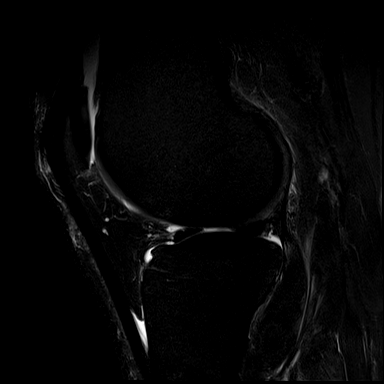
[im 15/30]
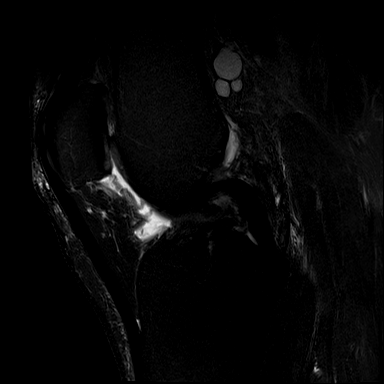
[im 20/30]
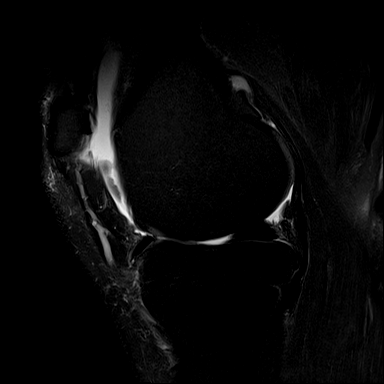
[im 25/30]
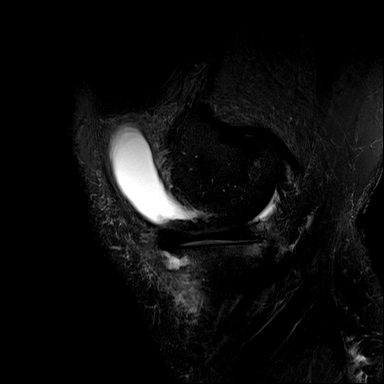
[im 30/30]
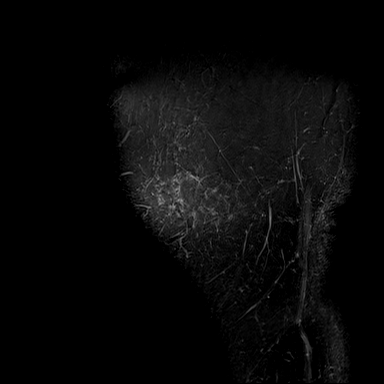

[40 of 40 positions shown; findings below may reference images not displayed]

FINDINGS: MENISCI

Medial meniscus: A horizontal tear in the posterior horn and body
reaches the meniscal undersurface. There is fraying along the free
edge of the posterior horn and body and the body is diminutive.

Lateral meniscus:  Intact.

LIGAMENTS

Cruciates:  Intact.

Collaterals:  Intact.

CARTILAGE

Patellofemoral: Mild cartilage degeneration is seen at the patellar
apex and along the medial facet of the patella.

Medial:  Markedly thinned throughout.

Lateral:  Minimally degenerated.

Joint: Small to moderate joint effusion. There is a loose body
scratch the a loose body measuring 0.8 cm AP by 0.8 cm transverse by
0.4 cm craniocaudal is seen in the anterior aspect of the lateral
compartment over the root of the anterior horn lateral meniscus.

Popliteal Fossa: No Baker's cyst. There is a septated cyst measuring
1.4 cm transverse by 1.1 cm AP by 2.2 cm craniocaudal posterior to
the distal femur in the superior most aspect of the intercondylar
notch.

Extensor Mechanism:  Intact.

Bones: No fracture or worrisome lesion. Bulky tricompartmental
osteophytes are worst medially. No fracture, stress change or
worrisome lesion.

Other: None.
IMPRESSION: Horizontal tear posterior horn and posterior body of the medial
meniscus. The body of the medial meniscus is degenerated and
diminutive.

Tricompartmental osteoarthritis is asymmetrically worst in the
medial compartment. Loose body in the anterior aspect of the lateral
compartment noted.

Ganglion cyst posterior to the distal femur noted.

## 2021-07-07 DIAGNOSIS — G4733 Obstructive sleep apnea (adult) (pediatric): Secondary | ICD-10-CM | POA: Diagnosis not present

## 2021-07-07 DIAGNOSIS — I10 Essential (primary) hypertension: Secondary | ICD-10-CM | POA: Diagnosis not present

## 2021-07-07 DIAGNOSIS — I872 Venous insufficiency (chronic) (peripheral): Secondary | ICD-10-CM | POA: Diagnosis not present

## 2021-07-07 DIAGNOSIS — R7301 Impaired fasting glucose: Secondary | ICD-10-CM | POA: Diagnosis not present

## 2021-07-07 DIAGNOSIS — E7800 Pure hypercholesterolemia, unspecified: Secondary | ICD-10-CM | POA: Diagnosis not present

## 2021-07-07 DIAGNOSIS — Z0000 Encounter for general adult medical examination without abnormal findings: Secondary | ICD-10-CM | POA: Diagnosis not present

## 2021-11-30 ENCOUNTER — Ambulatory Visit: Payer: Self-pay

## 2021-11-30 NOTE — Patient Outreach (Signed)
  Care Coordination   11/30/2021 Name: Robert Barron MRN: [id number] DOB: 1951/04/11   Care Coordination Outreach Attempts:  An unsuccessful telephone outreach was attempted today to offer the patient information about available care coordination services as a benefit of their health plan.   Follow Up Plan:  Additional outreach attempts will be made to offer the patient care coordination information and services.   Encounter Outcome:  No Answer  Care Coordination Interventions Activated:  No   Care Coordination Interventions:  No, not indicated  (Unsuccessful Call)   Katha Cabal Care Management [phone]

## 2022-01-11 ENCOUNTER — Other Ambulatory Visit: Payer: Self-pay | Admitting: Internal Medicine

## 2022-01-11 DIAGNOSIS — E786 Lipoprotein deficiency: Secondary | ICD-10-CM | POA: Diagnosis not present

## 2022-01-11 DIAGNOSIS — G4733 Obstructive sleep apnea (adult) (pediatric): Secondary | ICD-10-CM | POA: Diagnosis not present

## 2022-01-11 DIAGNOSIS — R7301 Impaired fasting glucose: Secondary | ICD-10-CM | POA: Diagnosis not present

## 2022-01-11 DIAGNOSIS — I10 Essential (primary) hypertension: Secondary | ICD-10-CM | POA: Diagnosis not present

## 2022-03-08 ENCOUNTER — Ambulatory Visit
Admission: RE | Admit: 2022-03-08 | Discharge: 2022-03-08 | Disposition: A | Discharge status: Home / Self Care | Payer: No Typology Code available for payment source | Source: Ambulatory Visit | Attending: Internal Medicine | Admitting: Internal Medicine

## 2022-03-08 DIAGNOSIS — I10 Essential (primary) hypertension: Secondary | ICD-10-CM | POA: Diagnosis not present

## 2022-03-08 DIAGNOSIS — E786 Lipoprotein deficiency: Secondary | ICD-10-CM

## 2022-04-10 ENCOUNTER — Other Ambulatory Visit: Payer: PPO

## 2022-07-21 DIAGNOSIS — Z125 Encounter for screening for malignant neoplasm of prostate: Secondary | ICD-10-CM | POA: Diagnosis not present

## 2022-07-21 DIAGNOSIS — Z9989 Dependence on other enabling machines and devices: Secondary | ICD-10-CM | POA: Diagnosis not present

## 2022-07-21 DIAGNOSIS — Z1331 Encounter for screening for depression: Secondary | ICD-10-CM | POA: Diagnosis not present

## 2022-07-21 DIAGNOSIS — M25521 Pain in right elbow: Secondary | ICD-10-CM | POA: Diagnosis not present

## 2022-07-21 DIAGNOSIS — Z79899 Other long term (current) drug therapy: Secondary | ICD-10-CM | POA: Diagnosis not present

## 2022-07-21 DIAGNOSIS — I872 Venous insufficiency (chronic) (peripheral): Secondary | ICD-10-CM | POA: Diagnosis not present

## 2022-07-21 DIAGNOSIS — G4733 Obstructive sleep apnea (adult) (pediatric): Secondary | ICD-10-CM | POA: Diagnosis not present

## 2022-07-21 DIAGNOSIS — E7800 Pure hypercholesterolemia, unspecified: Secondary | ICD-10-CM | POA: Diagnosis not present

## 2022-07-21 DIAGNOSIS — I10 Essential (primary) hypertension: Secondary | ICD-10-CM | POA: Diagnosis not present

## 2022-07-21 DIAGNOSIS — Z0000 Encounter for general adult medical examination without abnormal findings: Secondary | ICD-10-CM | POA: Diagnosis not present

## 2022-07-21 DIAGNOSIS — R7303 Prediabetes: Secondary | ICD-10-CM | POA: Diagnosis not present

## 2023-01-18 DIAGNOSIS — Z9989 Dependence on other enabling machines and devices: Secondary | ICD-10-CM | POA: Diagnosis not present

## 2023-01-18 DIAGNOSIS — R7303 Prediabetes: Secondary | ICD-10-CM | POA: Diagnosis not present

## 2023-01-18 DIAGNOSIS — Z125 Encounter for screening for malignant neoplasm of prostate: Secondary | ICD-10-CM | POA: Diagnosis not present

## 2023-01-18 DIAGNOSIS — M7989 Other specified soft tissue disorders: Secondary | ICD-10-CM | POA: Diagnosis not present

## 2023-01-18 DIAGNOSIS — E7800 Pure hypercholesterolemia, unspecified: Secondary | ICD-10-CM | POA: Diagnosis not present

## 2023-01-18 DIAGNOSIS — I10 Essential (primary) hypertension: Secondary | ICD-10-CM | POA: Diagnosis not present

## 2023-01-18 DIAGNOSIS — G4733 Obstructive sleep apnea (adult) (pediatric): Secondary | ICD-10-CM | POA: Diagnosis not present

## 2023-02-27 DIAGNOSIS — G4733 Obstructive sleep apnea (adult) (pediatric): Secondary | ICD-10-CM | POA: Diagnosis not present

## 2023-03-30 DIAGNOSIS — G4733 Obstructive sleep apnea (adult) (pediatric): Secondary | ICD-10-CM | POA: Diagnosis not present

## 2023-04-29 DIAGNOSIS — G4733 Obstructive sleep apnea (adult) (pediatric): Secondary | ICD-10-CM | POA: Diagnosis not present

## 2023-07-26 DIAGNOSIS — R931 Abnormal findings on diagnostic imaging of heart and coronary circulation: Secondary | ICD-10-CM | POA: Diagnosis not present

## 2023-07-26 DIAGNOSIS — Z0000 Encounter for general adult medical examination without abnormal findings: Secondary | ICD-10-CM | POA: Diagnosis not present

## 2023-07-26 DIAGNOSIS — G4733 Obstructive sleep apnea (adult) (pediatric): Secondary | ICD-10-CM | POA: Diagnosis not present

## 2023-07-26 DIAGNOSIS — I872 Venous insufficiency (chronic) (peripheral): Secondary | ICD-10-CM | POA: Diagnosis not present

## 2023-07-26 DIAGNOSIS — R7303 Prediabetes: Secondary | ICD-10-CM | POA: Diagnosis not present

## 2023-07-26 DIAGNOSIS — I10 Essential (primary) hypertension: Secondary | ICD-10-CM | POA: Diagnosis not present

## 2023-07-26 DIAGNOSIS — Z79899 Other long term (current) drug therapy: Secondary | ICD-10-CM | POA: Diagnosis not present

## 2023-07-26 DIAGNOSIS — Z125 Encounter for screening for malignant neoplasm of prostate: Secondary | ICD-10-CM | POA: Diagnosis not present

## 2023-07-26 DIAGNOSIS — E7800 Pure hypercholesterolemia, unspecified: Secondary | ICD-10-CM | POA: Diagnosis not present

## 2023-07-26 DIAGNOSIS — Z1331 Encounter for screening for depression: Secondary | ICD-10-CM | POA: Diagnosis not present

## 2023-07-28 DIAGNOSIS — G4733 Obstructive sleep apnea (adult) (pediatric): Secondary | ICD-10-CM | POA: Diagnosis not present

## 2023-08-28 DIAGNOSIS — G4733 Obstructive sleep apnea (adult) (pediatric): Secondary | ICD-10-CM | POA: Diagnosis not present

## 2023-09-25 DIAGNOSIS — Z79899 Other long term (current) drug therapy: Secondary | ICD-10-CM | POA: Diagnosis not present

## 2023-10-09 DIAGNOSIS — G4733 Obstructive sleep apnea (adult) (pediatric): Secondary | ICD-10-CM | POA: Diagnosis not present

## 2023-11-27 DIAGNOSIS — G4733 Obstructive sleep apnea (adult) (pediatric): Secondary | ICD-10-CM | POA: Diagnosis not present

## 2023-12-28 DIAGNOSIS — G4733 Obstructive sleep apnea (adult) (pediatric): Secondary | ICD-10-CM | POA: Diagnosis not present

## 2024-01-24 DIAGNOSIS — G4733 Obstructive sleep apnea (adult) (pediatric): Secondary | ICD-10-CM | POA: Diagnosis not present

## 2024-01-24 DIAGNOSIS — I10 Essential (primary) hypertension: Secondary | ICD-10-CM | POA: Diagnosis not present

## 2024-01-24 DIAGNOSIS — M79644 Pain in right finger(s): Secondary | ICD-10-CM | POA: Diagnosis not present

## 2024-01-24 DIAGNOSIS — Z23 Encounter for immunization: Secondary | ICD-10-CM | POA: Diagnosis not present

## 2024-01-28 DIAGNOSIS — G4733 Obstructive sleep apnea (adult) (pediatric): Secondary | ICD-10-CM | POA: Diagnosis not present

## 2024-02-11 ENCOUNTER — Other Ambulatory Visit: Payer: Self-pay

## 2024-02-11 ENCOUNTER — Ambulatory Visit: Admitting: Orthopedic Surgery

## 2024-02-11 DIAGNOSIS — R2231 Localized swelling, mass and lump, right upper limb: Secondary | ICD-10-CM

## 2024-02-11 NOTE — Progress Notes (Signed)
 Robert Barron - 73 y.o. male MRN [id number]  Date of birth: 21-Sep-1950  Office Visit Note: Visit Date: 02/11/2024 PCP: Signa Rush, MD (Inactive) Referred by: Charlott Dorn LABOR, *  Subjective: No chief complaint on file.  HPI: Robert Barron is a pleasant 73 y.o. male who presents today for specific hand surgical evaluation for a right small finger mass that is been present now for roughly 3 to 4 years.  States that it has grown in size and become more symptomatic recently.  It is located over the dorsal aspect of the P2 region of the pinky finger.  States that it is symptomatic with heavy grip, he does play a good amount of golf and does notice this as well.  At this juncture, he is interested in potential surgical excision.  Pertinent ROS were reviewed with the patient and found to be negative unless otherwise specified above in HPI.   Visit Reason: Right small finger cyst Duration of symptoms: 3-4 years, grows and changes over time Hand dominance: right Occupation: retired Diabetic: No Smoking: No Heart/Lung History: none Blood Thinners: aspirin  81 mg  Prior Testing/EMG: none Injections (Date): none Treatments: none Prior Surgery: none  Assessment & Plan: Visit Diagnoses:  1. Finger mass, right     Plan: Extensive discussion was held with patient today regarding his right small finger mass.  This does appear to be cystic in nature based on his clinical examination today.  We discussed differential diagnosis of this mass including ganglion cyst, giant cell tumor of the tendon sheath, fibroid, desmoid tumor, inclusion cyst and other pathologies.  We discussed conservative versus surgical treatment measures.  From a conservative standpoint, we discussed ongoing observation and activity modification.  From a surgical standpoint, we discussed mass excision.  Risks and benefits of the procedure were discussed, risks including but not limited to infection,  bleeding, scarring, stiffness, nerve injury, tendon injury, vascular injury, recurrence of symptoms and need for subsequent operation.  We also discussed the appropriate postoperative protocol and timeframe for return to activities and function.  Patient expressed understanding.    Understanding's, he would like to proceed with surgical intervention.  Will move forward with surgical scheduling of right small finger mass excision at the next available date per patient preference.  Surgery will be performed under local anesthesia.  Follow-up: No follow-ups on file.   Meds & Orders: No orders of the defined types were placed in this encounter.   Orders Placed This Encounter  Procedures   XR Finger Little Right     Procedures: No procedures performed      Clinical History: No specialty comments available.  He reports that he has never smoked. He has never used smokeless tobacco. No results for input(s): HGBA1C, LABURIC in the last 8760 hours.  Objective:   Vital Signs: There were no vitals taken for this visit.  Physical Exam  Gen: Well-appearing, in no acute distress; non-toxic CV: Regular Rate. Well-perfused. Warm.  Resp: Breathing unlabored on room air; no wheezing. Psych: Fluid speech in conversation; appropriate affect; normal thought process  Ortho Exam Right hand: Small finger - Palpable mass over the dorsal aspect of the small finger at the P2 level, measures approximately 2 cm x 2 cm, soft, compressible, mobile, minimally tender - Range of motion is appropriate throughout the small finger, sensation is intact distally, digit remains warm well-perfused with appropriate color and capillary refill   Imaging: XR Finger Little Right Result Date: 02/11/2024 X-rays of the  right small finger demonstrate well located MCP, PIP and DIP joints.  No significant degenerative changes.  Soft tissue swelling is seen at the level of the palpable mass.   Past  Medical/Family/Surgical/Social History: Medications & Allergies reviewed per EMR, new medications updated. 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 Past Surgical History:  Procedure Laterality Date   COLONOSCOPY WITH PROPOFOL  N/A 10/13/2014   Procedure: COLONOSCOPY WITH PROPOFOL ;  Surgeon: Gladis MARLA Louder, MD;  Location: [facility];  Service: Endoscopy;  Laterality: N/A;   PATELLAR TENDON REPAIR Left 06/04/2017   Procedure: PATELLA TENDON REPAIR;  Surgeon: Yvone Rush, MD;  Location: [facility] OR;  Service: Orthopedics;  Laterality: Left;   wisdom teeth extraction and large tori removed     Social History   Occupational History   Not on file  Tobacco Use   Smoking status: Never   Smokeless tobacco: Never  Substance and Sexual Activity   Alcohol use: No   Drug use: No   Sexual activity: Yes    Wiley Flicker Afton Alderton, M.D. [facility], Hand Surgery

## 2024-03-03 ENCOUNTER — Encounter: Payer: Self-pay | Admitting: Radiology

## 2024-03-05 ENCOUNTER — Other Ambulatory Visit: Payer: Self-pay | Admitting: Orthopedic Surgery

## 2024-03-05 ENCOUNTER — Other Ambulatory Visit (HOSPITAL_COMMUNITY): Payer: Self-pay

## 2024-03-05 MED ORDER — ACETAMINOPHEN-CODEINE 300-30 MG PO TABS
1.0000 | ORAL_TABLET | Freq: Four times a day (QID) | ORAL | 0 refills | Status: DC | PRN
  Filled 2024-03-05: qty 30, 8d supply, fill #0
  Filled 2024-03-05: qty 28, 7d supply, fill #0

## 2024-03-06 ENCOUNTER — Other Ambulatory Visit (HOSPITAL_COMMUNITY): Payer: Self-pay

## 2024-03-06 ENCOUNTER — Telehealth: Payer: Self-pay | Admitting: Orthopedic Surgery

## 2024-03-06 ENCOUNTER — Other Ambulatory Visit: Payer: Self-pay | Admitting: Orthopedic Surgery

## 2024-03-06 DIAGNOSIS — M67441 Ganglion, right hand: Secondary | ICD-10-CM | POA: Diagnosis not present

## 2024-03-06 DIAGNOSIS — R2231 Localized swelling, mass and lump, right upper limb: Secondary | ICD-10-CM | POA: Diagnosis not present

## 2024-03-06 MED ORDER — ACETAMINOPHEN-CODEINE 300-30 MG PO TABS: 1.0000 | ORAL_TABLET | Freq: Four times a day (QID) | ORAL | 0 refills | Status: AC | PRN

## 2024-03-06 NOTE — Telephone Encounter (Signed)
 Patient wants to know if you could send his pain medication to the [facility] in [address] Please. [phone]

## 2024-03-07 NOTE — Telephone Encounter (Signed)
 Rx resent to correct pharmacy

## 2024-03-19 NOTE — Progress Notes (Unsigned)
   Robert Barron - 73 y.o. male MRN [id number]  Date of birth: Aug 16, 1950  Office Visit Note: Visit Date: 03/20/2024 PCP: Signa Rush, MD (Inactive) Referred by: No ref. provider found  Subjective:  HPI: Robert Barron is a 73 y.o. male who presents today for follow up 2 weeks status post right small finger mass excision.  Pertinent ROS were reviewed with the patient and found to be negative unless otherwise specified above in HPI.   Assessment & Plan: Visit Diagnoses: No diagnosis found.  Plan: ***  Follow-up: No follow-ups on file.   Meds & Orders: No orders of the defined types were placed in this encounter.  No orders of the defined types were placed in this encounter.    Procedures: No procedures performed       Objective:   Vital Signs: There were no vitals taken for this visit.  Ortho Exam ***  Imaging: No results found.   Annelies Coyt Afton Alderton, M.D. [facility], Hand Surgery

## 2024-03-20 ENCOUNTER — Ambulatory Visit (INDEPENDENT_AMBULATORY_CARE_PROVIDER_SITE_OTHER): Admitting: Orthopedic Surgery

## 2024-03-20 DIAGNOSIS — R2231 Localized swelling, mass and lump, right upper limb: Secondary | ICD-10-CM
